# Patient Record
Sex: Female | Born: 1980 | Race: Black or African American | Hispanic: No | Marital: Married | Smoking: Never smoker
Health system: Southern US, Community
[De-identification: ages and names within clinical notes are randomized; demographics above are authoritative.]

## PROBLEM LIST (undated history)

## (undated) ENCOUNTER — Inpatient Hospital Stay (HOSPITAL_COMMUNITY): Payer: BC Managed Care – PPO

## (undated) DIAGNOSIS — D242 Benign neoplasm of left breast: Secondary | ICD-10-CM

## (undated) HISTORY — PX: NO PAST SURGERIES: SHX2092

---

## 2011-03-29 ENCOUNTER — Inpatient Hospital Stay (HOSPITAL_COMMUNITY)
Admission: AD | Admit: 2011-03-29 | Discharge: 2011-03-31 | DRG: 373 | Disposition: A | Discharge status: Home Health | Payer: BC Managed Care – PPO | Source: Ambulatory Visit | Attending: Obstetrics and Gynecology | Admitting: Obstetrics and Gynecology

## 2011-03-29 LAB — CBC
Platelets: 291 10*3/uL (ref 150–400)
RBC: 4.26 MIL/uL (ref 3.87–5.11)
RDW: 12.8 % (ref 11.5–15.5)
WBC: 14 10*3/uL — ABNORMAL HIGH (ref 4.0–10.5)

## 2011-03-29 LAB — RPR: RPR Ser Ql: NONREACTIVE

## 2011-03-30 LAB — CBC
Hemoglobin: 10.8 g/dL — ABNORMAL LOW (ref 12.0–15.0)
Platelets: 246 10*3/uL (ref 150–400)
RBC: 3.68 MIL/uL — ABNORMAL LOW (ref 3.87–5.11)
WBC: 16.1 10*3/uL — ABNORMAL HIGH (ref 4.0–10.5)

## 2013-11-21 LAB — OB RESULTS CONSOLE ABO/RH: RH TYPE: POSITIVE

## 2013-11-21 LAB — OB RESULTS CONSOLE RPR: RPR: NONREACTIVE

## 2013-11-21 LAB — OB RESULTS CONSOLE RUBELLA ANTIBODY, IGM: Rubella: IMMUNE

## 2013-11-21 LAB — OB RESULTS CONSOLE HEPATITIS B SURFACE ANTIGEN: Hepatitis B Surface Ag: NEGATIVE

## 2013-11-21 LAB — OB RESULTS CONSOLE ANTIBODY SCREEN: ANTIBODY SCREEN: NEGATIVE

## 2013-11-21 LAB — OB RESULTS CONSOLE HIV ANTIBODY (ROUTINE TESTING): HIV: NONREACTIVE

## 2014-02-18 ENCOUNTER — Inpatient Hospital Stay (HOSPITAL_COMMUNITY)
Admission: AD | Admit: 2014-02-18 | Payer: BC Managed Care – PPO | Source: Ambulatory Visit | Admitting: Obstetrics and Gynecology

## 2014-06-13 LAB — OB RESULTS CONSOLE GBS: GBS: NEGATIVE

## 2014-06-20 ENCOUNTER — Encounter (HOSPITAL_COMMUNITY): Payer: Self-pay | Admitting: *Deleted

## 2014-06-20 ENCOUNTER — Inpatient Hospital Stay (HOSPITAL_COMMUNITY)
Admission: AD | Admit: 2014-06-20 | Discharge: 2014-06-22 | DRG: 775 | Disposition: A | Discharge status: Home / Self Care | Payer: BC Managed Care – PPO | Source: Ambulatory Visit | Attending: Obstetrics and Gynecology | Admitting: Obstetrics and Gynecology

## 2014-06-20 DIAGNOSIS — IMO0001 Reserved for inherently not codable concepts without codable children: Secondary | ICD-10-CM

## 2014-06-20 LAB — CBC
HEMATOCRIT: 34.5 % — AB (ref 36.0–46.0)
HEMOGLOBIN: 11.1 g/dL — AB (ref 12.0–15.0)
MCH: 29.6 pg (ref 26.0–34.0)
MCHC: 32.2 g/dL (ref 30.0–36.0)
MCV: 92 fL (ref 78.0–100.0)
Platelets: 255 10*3/uL (ref 150–400)
RBC: 3.75 MIL/uL — ABNORMAL LOW (ref 3.87–5.11)
RDW: 12.5 % (ref 11.5–15.5)
WBC: 10.4 10*3/uL (ref 4.0–10.5)

## 2014-06-20 LAB — RPR

## 2014-06-20 MED ORDER — BUTORPHANOL TARTRATE 1 MG/ML IJ SOLN
1.0000 mg | Freq: Once | INTRAMUSCULAR | Status: AC
  Administered 2014-06-20: 1 mg via INTRAVENOUS
  Filled 2014-06-20: qty 1

## 2014-06-20 MED ORDER — ACETAMINOPHEN 325 MG PO TABS: 650.0000 mg | ORAL_TABLET | ORAL | Status: DC | PRN

## 2014-06-20 MED ORDER — TETANUS-DIPHTH-ACELL PERTUSSIS 5-2.5-18.5 LF-MCG/0.5 IM SUSP: 0.5000 mL | Freq: Once | INTRAMUSCULAR | Status: DC

## 2014-06-20 MED ORDER — SIMETHICONE 80 MG PO CHEW
80.0000 mg | CHEWABLE_TABLET | ORAL | Status: DC | PRN
Start: 2014-06-20 — End: 2014-06-22

## 2014-06-20 MED ORDER — EPHEDRINE 5 MG/ML INJ
10.0000 mg | INTRAVENOUS | Status: DC | PRN
  Filled 2014-06-20: qty 2

## 2014-06-20 MED ORDER — SENNOSIDES-DOCUSATE SODIUM 8.6-50 MG PO TABS
2.0000 | ORAL_TABLET | ORAL | Status: DC
  Administered 2014-06-21 (×2): 2 via ORAL
  Filled 2014-06-20 (×2): qty 2

## 2014-06-20 MED ORDER — CITRIC ACID-SODIUM CITRATE 334-500 MG/5ML PO SOLN: 30.0000 mL | ORAL | Status: DC | PRN

## 2014-06-20 MED ORDER — WITCH HAZEL-GLYCERIN EX PADS: 1.0000 "application " | MEDICATED_PAD | CUTANEOUS | Status: DC | PRN

## 2014-06-20 MED ORDER — DIPHENHYDRAMINE HCL 50 MG/ML IJ SOLN: 12.5000 mg | INTRAMUSCULAR | Status: DC | PRN

## 2014-06-20 MED ORDER — ONDANSETRON HCL 4 MG/2ML IJ SOLN: 4.0000 mg | INTRAMUSCULAR | Status: DC | PRN

## 2014-06-20 MED ORDER — LACTATED RINGERS IV SOLN: 500.0000 mL | Freq: Once | INTRAVENOUS | Status: DC

## 2014-06-20 MED ORDER — ONDANSETRON HCL 4 MG PO TABS: 4.0000 mg | ORAL_TABLET | ORAL | Status: DC | PRN

## 2014-06-20 MED ORDER — LACTATED RINGERS IV SOLN: 500.0000 mL | INTRAVENOUS | Status: DC | PRN

## 2014-06-20 MED ORDER — DIBUCAINE 1 % RE OINT: 1.0000 "application " | TOPICAL_OINTMENT | RECTAL | Status: DC | PRN

## 2014-06-20 MED ORDER — LIDOCAINE HCL (PF) 1 % IJ SOLN
30.0000 mL | INTRAMUSCULAR | Status: DC | PRN
  Administered 2014-06-20: 30 mL via SUBCUTANEOUS
  Filled 2014-06-20: qty 30

## 2014-06-20 MED ORDER — FLEET ENEMA 7-19 GM/118ML RE ENEM: 1.0000 | ENEMA | RECTAL | Status: DC | PRN

## 2014-06-20 MED ORDER — FLEET ENEMA 7-19 GM/118ML RE ENEM: 1.0000 | ENEMA | Freq: Every day | RECTAL | Status: DC | PRN

## 2014-06-20 MED ORDER — OXYTOCIN 40 UNITS IN LACTATED RINGERS INFUSION - SIMPLE MED
62.5000 mL/h | INTRAVENOUS | Status: DC
  Filled 2014-06-20: qty 1000

## 2014-06-20 MED ORDER — BISACODYL 10 MG RE SUPP: 10.0000 mg | Freq: Every day | RECTAL | Status: DC | PRN

## 2014-06-20 MED ORDER — BENZOCAINE-MENTHOL 20-0.5 % EX AERO
1.0000 "application " | INHALATION_SPRAY | CUTANEOUS | Status: DC | PRN
  Administered 2014-06-20: 1 via TOPICAL
  Filled 2014-06-20 (×2): qty 56

## 2014-06-20 MED ORDER — PHENYLEPHRINE 40 MCG/ML (10ML) SYRINGE FOR IV PUSH (FOR BLOOD PRESSURE SUPPORT)
80.0000 ug | PREFILLED_SYRINGE | INTRAVENOUS | Status: DC | PRN
  Filled 2014-06-20: qty 2

## 2014-06-20 MED ORDER — IBUPROFEN 600 MG PO TABS
600.0000 mg | ORAL_TABLET | Freq: Four times a day (QID) | ORAL | Status: DC | PRN
  Administered 2014-06-20: 600 mg via ORAL
  Filled 2014-06-20: qty 1

## 2014-06-20 MED ORDER — FENTANYL 2.5 MCG/ML BUPIVACAINE 1/10 % EPIDURAL INFUSION (WH - ANES): 14.0000 mL/h | INTRAMUSCULAR | Status: DC | PRN

## 2014-06-20 MED ORDER — ONDANSETRON HCL 4 MG/2ML IJ SOLN: 4.0000 mg | Freq: Four times a day (QID) | INTRAMUSCULAR | Status: DC | PRN

## 2014-06-20 MED ORDER — OXYTOCIN BOLUS FROM INFUSION
500.0000 mL | INTRAVENOUS | Status: DC
  Administered 2014-06-20: 500 mL via INTRAVENOUS

## 2014-06-20 MED ORDER — PRENATAL MULTIVITAMIN CH
1.0000 | ORAL_TABLET | Freq: Every day | ORAL | Status: DC
  Administered 2014-06-20 – 2014-06-22 (×3): 1 via ORAL
  Filled 2014-06-20 (×3): qty 1

## 2014-06-20 MED ORDER — LACTATED RINGERS IV SOLN: INTRAVENOUS | Status: DC

## 2014-06-20 MED ORDER — OXYCODONE-ACETAMINOPHEN 5-325 MG PO TABS: 1.0000 | ORAL_TABLET | ORAL | Status: DC | PRN

## 2014-06-20 MED ORDER — ZOLPIDEM TARTRATE 5 MG PO TABS: 5.0000 mg | ORAL_TABLET | Freq: Every evening | ORAL | Status: DC | PRN

## 2014-06-20 MED ORDER — LANOLIN HYDROUS EX OINT: TOPICAL_OINTMENT | CUTANEOUS | Status: DC | PRN

## 2014-06-20 MED ORDER — IBUPROFEN 600 MG PO TABS
600.0000 mg | ORAL_TABLET | Freq: Four times a day (QID) | ORAL | Status: DC
Start: 2014-06-20 — End: 2014-06-22
  Administered 2014-06-20 – 2014-06-22 (×9): 600 mg via ORAL
  Filled 2014-06-20 (×9): qty 1

## 2014-06-20 MED ORDER — DIPHENHYDRAMINE HCL 25 MG PO CAPS: 25.0000 mg | ORAL_CAPSULE | Freq: Four times a day (QID) | ORAL | Status: DC | PRN

## 2014-06-20 NOTE — Progress Notes (Signed)
Delivery Note At 6:36 AM a viable female was delivered via Vaginal, Spontaneous Delivery (Presentation: Left Occiput Anterior).  APGAR:8 ,9 ; weight .   Placenta status: Intact, Spontaneous.  Cord: 3 vessels with the following complications: None.  Cord pH: pending SROM clear Anesthesia: None  Episiotomy: None Lacerations: 2nd degree Suture Repair: 2.0 vicryl rapide Est. Blood Loss (mL): 250  Mom to postpartum.  Baby to Couplet care / Skin to Skin.  TOMBLIN II,JAMES E 06/20/2014, 6:56 AM

## 2014-06-20 NOTE — H&P (Signed)
Jill Hicks is a 33 y.o. female presenting for UCs. No ROM at home. Maternal Medical History:  Reason for admission: Contractions.   Fetal activity: Perceived fetal activity is normal.      OB History   Grav Para Term Preterm Abortions TAB SAB Ect Mult Living   3    1  1   1      No past medical history on file. No past surgical history on file. Family History: family history is not on file. Social History:  has no tobacco, alcohol, and drug history on file.   Prenatal Transfer Tool  Maternal Diabetes: No Genetic Screening: Normal Maternal Ultrasounds/Referrals: Normal Fetal Ultrasounds or other Referrals:  None Maternal Substance Abuse:  No Significant Maternal Medications:  None Significant Maternal Lab Results:  None Other Comments:  None  Review of Systems  Eyes: Negative for blurred vision.  Neurological: Negative for headaches.    Dilation: 10 Effacement (%): 100 Station: +2 Exam by:: K. Lashley RN Blood pressure 126/63, pulse 67, temperature 98.1 F (36.7 C), temperature source Axillary, resp. rate 22, height 5\' 6"  (1.676 m), weight 73.483 kg (162 lb). Maternal Exam:  Uterine Assessment: Contraction strength is firm.  Contraction frequency is regular.   Abdomen: Fetal presentation: vertex     Fetal Exam Fetal State Assessment: Category I - tracings are normal.     Physical Exam  Cardiovascular: Normal rate.   Respiratory: Effort normal.  Neurological: She has normal reflexes.    Prenatal labs: ABO, Rh: A/Positive/-- (12/01 0000) Antibody: Negative (12/01 0000) Rubella: Immune (12/01 0000) RPR: Nonreactive (12/01 0000)  HBsAg: Negative (12/01 0000)  HIV: Non-reactive (12/01 0000)  GBS: Negative (06/23 0000)   Assessment/Plan: 33 yo G3P1 at 38 2.7 weeks in active labor   TOMBLIN II,JAMES E 06/20/2014, 6:54 AM

## 2014-06-21 LAB — CBC
HCT: 30.8 % — ABNORMAL LOW (ref 36.0–46.0)
Hemoglobin: 10 g/dL — ABNORMAL LOW (ref 12.0–15.0)
MCH: 30.1 pg (ref 26.0–34.0)
MCHC: 32.5 g/dL (ref 30.0–36.0)
MCV: 92.8 fL (ref 78.0–100.0)
PLATELETS: 240 10*3/uL (ref 150–400)
RBC: 3.32 MIL/uL — ABNORMAL LOW (ref 3.87–5.11)
RDW: 12.6 % (ref 11.5–15.5)
WBC: 12.7 10*3/uL — ABNORMAL HIGH (ref 4.0–10.5)

## 2014-06-21 NOTE — Progress Notes (Signed)
Post Partum Day 1 Subjective: no complaints, up ad lib, voiding and tolerating PO  Objective: Blood pressure 103/64, pulse 68, temperature 97.5 F (36.4 C), temperature source Oral, resp. rate 18, height 5\' 6"  (1.676 m), weight 162 lb (73.483 kg), unknown if currently breastfeeding.  Physical Exam:  General: alert and cooperative Lochia: appropriate Uterine Fundus: firm Incision: perineum intact DVT Evaluation: No evidence of DVT seen on physical exam. Negative Homan's sign. No cords or calf tenderness. No significant calf/ankle edema.   Recent Labs  06/20/14 0603 06/21/14 0550  HGB 11.1* 10.0*  HCT 34.5* 30.8*    Assessment/Plan: Plan for discharge tomorrow and Circumcision prior to discharge   LOS: 1 day   CURTIS,CAROL G 06/21/2014, 7:58 AM

## 2014-06-22 MED ORDER — IBUPROFEN 600 MG PO TABS: 600.0000 mg | ORAL_TABLET | Freq: Four times a day (QID) | ORAL | Status: DC

## 2014-06-22 NOTE — Lactation Note (Signed)
This note was copied from the chart of [place]. Lactation Consultation Note Baby just finishing a feeding. Mom states feeds are going much better. States she is more comfortable, and that her milk has increased.  Enc mom to call if she has any concerns.   Patient Name: Jill Hicks [id number] Date: 06/22/2014     Maternal Data    Feeding Feeding Type: Breast Fed Length of feed: 20 min  LATCH Score/Interventions                      Lactation Tools Discussed/Used     Consult Status      Dorise Bullion 06/22/2014, 10:41 AM

## 2014-06-22 NOTE — Discharge Summary (Signed)
Obstetric Discharge Summary Reason for Admission: onset of labor Prenatal Procedures: ultrasound Intrapartum Procedures: spontaneous vaginal delivery Postpartum Procedures: none Complications-Operative and Postpartum: 2 degree perineal laceration Hemoglobin  Date Value Ref Range Status  06/21/2014 10.0* 12.0 - 15.0 g/dL Final     HCT  Date Value Ref Range Status  06/21/2014 30.8* 36.0 - 46.0 % Final    Physical Exam:  General: alert and cooperative Lochia: appropriate Uterine Fundus: firm Incision: perineum intact DVT Evaluation: No evidence of DVT seen on physical exam. Negative Homan's sign. No cords or calf tenderness. No significant calf/ankle edema.  Discharge Diagnoses: Term Pregnancy-delivered  Discharge Information: Date: 06/22/2014 Activity: pelvic rest Diet: routine Medications: PNV and Ibuprofen Condition: stable Instructions: refer to practice specific booklet Discharge to: home   Newborn Data: Live born female  Birth Weight: 7 lb 2.1 oz (3235 g) APGAR: 8, 9  Home with mother.  Jill Hicks G 06/22/2014, 7:57 AM

## 2014-10-23 ENCOUNTER — Encounter (HOSPITAL_COMMUNITY): Payer: Self-pay | Admitting: *Deleted

## 2016-12-18 ENCOUNTER — Other Ambulatory Visit: Payer: Self-pay | Admitting: Obstetrics and Gynecology

## 2016-12-18 DIAGNOSIS — N63 Unspecified lump in unspecified breast: Secondary | ICD-10-CM

## 2016-12-25 ENCOUNTER — Ambulatory Visit
Admission: RE | Admit: 2016-12-25 | Discharge: 2016-12-25 | Disposition: A | Discharge status: Home / Self Care | Payer: BLUE CROSS/BLUE SHIELD | Source: Ambulatory Visit | Attending: Obstetrics and Gynecology | Admitting: Obstetrics and Gynecology

## 2016-12-25 ENCOUNTER — Other Ambulatory Visit: Payer: Self-pay | Admitting: Obstetrics and Gynecology

## 2016-12-25 DIAGNOSIS — N63 Unspecified lump in unspecified breast: Secondary | ICD-10-CM

## 2020-03-09 ENCOUNTER — Ambulatory Visit: Payer: Medicaid Other | Attending: Internal Medicine

## 2020-03-09 DIAGNOSIS — Z20822 Contact with and (suspected) exposure to covid-19: Secondary | ICD-10-CM

## 2020-03-10 LAB — NOVEL CORONAVIRUS, NAA: SARS-CoV-2, NAA: NOT DETECTED

## 2020-12-26 ENCOUNTER — Other Ambulatory Visit: Payer: Self-pay | Admitting: Obstetrics and Gynecology

## 2020-12-26 DIAGNOSIS — R928 Other abnormal and inconclusive findings on diagnostic imaging of breast: Secondary | ICD-10-CM

## 2021-01-09 ENCOUNTER — Other Ambulatory Visit: Payer: Self-pay

## 2021-01-09 ENCOUNTER — Ambulatory Visit
Admission: RE | Admit: 2021-01-09 | Discharge: 2021-01-09 | Disposition: A | Discharge status: Home / Self Care | Payer: Medicaid Other | Source: Ambulatory Visit | Attending: Obstetrics and Gynecology | Admitting: Obstetrics and Gynecology

## 2021-01-09 ENCOUNTER — Other Ambulatory Visit: Payer: Self-pay | Admitting: Obstetrics and Gynecology

## 2021-01-09 DIAGNOSIS — R921 Mammographic calcification found on diagnostic imaging of breast: Secondary | ICD-10-CM

## 2021-01-09 DIAGNOSIS — R928 Other abnormal and inconclusive findings on diagnostic imaging of breast: Secondary | ICD-10-CM

## 2021-01-11 ENCOUNTER — Ambulatory Visit
Admission: RE | Admit: 2021-01-11 | Discharge: 2021-01-11 | Disposition: A | Discharge status: Home / Self Care | Payer: Medicaid Other | Source: Ambulatory Visit | Attending: Obstetrics and Gynecology | Admitting: Obstetrics and Gynecology

## 2021-01-11 ENCOUNTER — Other Ambulatory Visit: Payer: Self-pay

## 2021-01-11 DIAGNOSIS — R928 Other abnormal and inconclusive findings on diagnostic imaging of breast: Secondary | ICD-10-CM

## 2021-01-11 DIAGNOSIS — R921 Mammographic calcification found on diagnostic imaging of breast: Secondary | ICD-10-CM

## 2021-03-13 ENCOUNTER — Encounter: Payer: Self-pay | Admitting: Nurse Practitioner

## 2021-03-13 ENCOUNTER — Other Ambulatory Visit: Payer: Self-pay

## 2021-03-13 ENCOUNTER — Ambulatory Visit (INDEPENDENT_AMBULATORY_CARE_PROVIDER_SITE_OTHER): Payer: Medicaid Other | Admitting: Nurse Practitioner

## 2021-03-13 VITALS — BP 113/60 | HR 70 | Ht 66.0 in | Wt 139.0 lb

## 2021-03-13 DIAGNOSIS — M25649 Stiffness of unspecified hand, not elsewhere classified: Secondary | ICD-10-CM

## 2021-03-13 DIAGNOSIS — Z7689 Persons encountering health services in other specified circumstances: Secondary | ICD-10-CM

## 2021-03-13 DIAGNOSIS — M25561 Pain in right knee: Secondary | ICD-10-CM | POA: Diagnosis not present

## 2021-03-13 DIAGNOSIS — E559 Vitamin D deficiency, unspecified: Secondary | ICD-10-CM | POA: Insufficient documentation

## 2021-03-13 DIAGNOSIS — M94261 Chondromalacia, right knee: Secondary | ICD-10-CM | POA: Insufficient documentation

## 2021-03-13 DIAGNOSIS — G8929 Other chronic pain: Secondary | ICD-10-CM

## 2021-03-13 DIAGNOSIS — N920 Excessive and frequent menstruation with regular cycle: Secondary | ICD-10-CM | POA: Insufficient documentation

## 2021-03-13 DIAGNOSIS — L259 Unspecified contact dermatitis, unspecified cause: Secondary | ICD-10-CM | POA: Insufficient documentation

## 2021-03-13 DIAGNOSIS — M25562 Pain in left knee: Secondary | ICD-10-CM

## 2021-03-13 NOTE — Patient Instructions (Addendum)
Health Maintenance, Female Adopting a healthy lifestyle and getting preventive care are important in promoting health and wellness. Ask your health care provider about:  The right schedule for you to have regular tests and exams.  Things you can do on your own to prevent diseases and keep yourself healthy. What should I know about diet, weight, and exercise? Eat a healthy diet  Eat a diet that includes plenty of vegetables, fruits, low-fat dairy products, and lean protein.  Do not eat a lot of foods that are high in solid fats, added sugars, or sodium.   Maintain a healthy weight Body mass index (BMI) is used to identify weight problems. It estimates body fat based on height and weight. Your health care provider can help determine your BMI and help you achieve or maintain a healthy weight. Get regular exercise Get regular exercise. This is one of the most important things you can do for your health. Most adults should:  Exercise for at least 150 minutes each week. The exercise should increase your heart rate and make you sweat (moderate-intensity exercise).  Do strengthening exercises at least twice a week. This is in addition to the moderate-intensity exercise.  Spend less time sitting. Even light physical activity can be beneficial. Watch cholesterol and blood lipids Have your blood tested for lipids and cholesterol at 41 years of age, then have this test every 5 years. Have your cholesterol levels checked more often if:  Your lipid or cholesterol levels are high.  You are older than 40 years of age.  You are at high risk for heart disease. What should I know about cancer screening? Depending on your health history and family history, you may need to have cancer screening at various ages. This may include screening for:  Breast cancer.  Cervical cancer.  Colorectal cancer.  Skin cancer.  Lung cancer. What should I know about heart disease, diabetes, and high blood  pressure? Blood pressure and heart disease  High blood pressure causes heart disease and increases the risk of stroke. This is more likely to develop in people who have high blood pressure readings, are of African descent, or are overweight.  Have your blood pressure checked: ? Every 3-5 years if you are 18-39 years of age. ? Every year if you are 40 years old or older. Diabetes Have regular diabetes screenings. This checks your fasting blood sugar level. Have the screening done:  Once every three years after age 40 if you are at a normal weight and have a low risk for diabetes.  More often and at a younger age if you are overweight or have a high risk for diabetes. What should I know about preventing infection? Hepatitis B If you have a higher risk for hepatitis B, you should be screened for this virus. Talk with your health care provider to find out if you are at risk for hepatitis B infection. Hepatitis C Testing is recommended for:  Everyone born from 1945 through 1965.  Anyone with known risk factors for hepatitis C. Sexually transmitted infections (STIs)  Get screened for STIs, including gonorrhea and chlamydia, if: ? You are sexually active and are younger than 41 years of age. ? You are older than 41 years of age and your health care provider tells you that you are at risk for this type of infection. ? Your sexual activity has changed since you were last screened, and you are at increased risk for chlamydia or gonorrhea. Ask your health care provider   if you are at risk.  Ask your health care provider about whether you are at high risk for HIV. Your health care provider may recommend a prescription medicine to help prevent HIV infection. If you choose to take medicine to prevent HIV, you should first get tested for HIV. You should then be tested every 3 months for as long as you are taking the medicine. Pregnancy  If you are about to stop having your period (premenopausal) and  you may become pregnant, seek counseling before you get pregnant.  Take 400 to 800 micrograms (mcg) of folic acid every day if you become pregnant.  Ask for birth control (contraception) if you want to prevent pregnancy. Osteoporosis and menopause Osteoporosis is a disease in which the bones lose minerals and strength with aging. This can result in bone fractures. If you are 60 years old or older, or if you are at risk for osteoporosis and fractures, ask your health care provider if you should:  Be screened for bone loss.  Take a calcium or vitamin D supplement to lower your risk of fractures.  Be given hormone replacement therapy (HRT) to treat symptoms of menopause. Follow these instructions at home: Lifestyle  Do not use any products that contain nicotine or tobacco, such as cigarettes, e-cigarettes, and chewing tobacco. If you need help quitting, ask your health care provider.  Do not use street drugs.  Do not share needles.  Ask your health care provider for help if you need support or information about quitting drugs. Alcohol use  Do not drink alcohol if: ? Your health care provider tells you not to drink. ? You are pregnant, may be pregnant, or are planning to become pregnant.  If you drink alcohol: ? Limit how much you use to 0-1 drink a day. ? Limit intake if you are breastfeeding.  Be aware of how much alcohol is in your drink. In the [place] one drink equals one 12 oz bottle of beer (355 mL), one 5 oz glass of wine (148 mL), or one 1 oz glass of hard liquor (44 mL). General instructions  Schedule regular health, dental, and eye exams.  Stay current with your vaccines.  Tell your health care provider if: ? You often feel depressed. ? You have ever been abused or do not feel safe at home. Summary  Adopting a healthy lifestyle and getting preventive care are important in promoting health and wellness.  Follow your health care provider's instructions about healthy  diet, exercising, and getting tested or screened for diseases.  Follow your health care provider's instructions on monitoring your cholesterol and blood pressure. This information is not intended to replace advice given to you by your health care provider. Make sure you discuss any questions you have with your health care provider. Document Revised: 12/01/2018 Document Reviewed: 12/01/2018 Elsevier Patient Education  2021 [employer].   Hand Exercises Hand exercises can be helpful for almost anyone. These exercises can strengthen the hands, improve flexibility and movement, and increase blood flow to the hands. These results can make work and daily tasks easier. Hand exercises can be especially helpful for people who have joint pain from arthritis or have nerve damage from overuse (carpal tunnel syndrome). These exercises can also help people who have injured a hand. Exercises Most of these hand exercises are gentle stretching and motion exercises. It is usually safe to do them often throughout the day. Warming up your hands before exercise may help to reduce stiffness. You can  do this with gentle massage or by placing your hands in warm water for 10-15 minutes. It is normal to feel some stretching, pulling, tightness, or mild discomfort as you begin new exercises. This will gradually improve. Stop an exercise right away if you feel sudden, severe pain or your pain gets worse. Ask your health care provider which exercises are best for you. Knuckle bend or "claw" fist 1. Stand or sit with your arm, hand, and all five fingers pointed straight up. Make sure to keep your wrist straight during the exercise. 2. Gently bend your fingers down toward your palm until the tips of your fingers are touching the top of your palm. Keep your big knuckle straight and just bend the small knuckles in your fingers. 3. Hold this position for __________ seconds. 4. Straighten (extend) your fingers back to the starting  position. Repeat this exercise 5-10 times with each hand. Full finger fist 1. Stand or sit with your arm, hand, and all five fingers pointed straight up. Make sure to keep your wrist straight during the exercise. 2. Gently bend your fingers into your palm until the tips of your fingers are touching the middle of your palm. 3. Hold this position for __________ seconds. 4. Extend your fingers back to the starting position, stretching every joint fully. Repeat this exercise 5-10 times with each hand. Straight fist 1. Stand or sit with your arm, hand, and all five fingers pointed straight up. Make sure to keep your wrist straight during the exercise. 2. Gently bend your fingers at the big knuckle, where your fingers meet your hand, and the middle knuckle. Keep the knuckle at the tips of your fingers straight and try to touch the bottom of your palm. 3. Hold this position for __________ seconds. 4. Extend your fingers back to the starting position, stretching every joint fully. Repeat this exercise 5-10 times with each hand. Tabletop 1. Stand or sit with your arm, hand, and all five fingers pointed straight up. Make sure to keep your wrist straight during the exercise. 2. Gently bend your fingers at the big knuckle, where your fingers meet your hand, as far down as you can while keeping the small knuckles in your fingers straight. Think of forming a tabletop with your fingers. 3. Hold this position for __________ seconds. 4. Extend your fingers back to the starting position, stretching every joint fully. Repeat this exercise 5-10 times with each hand. Finger spread 1. Place your hand flat on a table with your palm facing down. Make sure your wrist stays straight as you do this exercise. 2. Spread your fingers and thumb apart from each other as far as you can until you feel a gentle stretch. Hold this position for __________ seconds. 3. Bring your fingers and thumb tight together again. Hold this  position for __________ seconds. Repeat this exercise 5-10 times with each hand. Making circles 1. Stand or sit with your arm, hand, and all five fingers pointed straight up. Make sure to keep your wrist straight during the exercise. 2. Make a circle by touching the tip of your thumb to the tip of your index finger. 3. Hold for __________ seconds. Then open your hand wide. 4. Repeat this motion with your thumb and each finger on your hand. Repeat this exercise 5-10 times with each hand. Thumb motion 1. Sit with your forearm resting on a table and your wrist straight. Your thumb should be facing up toward the ceiling. Keep your fingers relaxed as  you move your thumb. 2. Lift your thumb up as high as you can toward the ceiling. Hold for __________ seconds. 3. Bend your thumb across your palm as far as you can, reaching the tip of your thumb for the small finger (pinkie) side of your palm. Hold for __________ seconds. Repeat this exercise 5-10 times with each hand. Grip strengthening 1. Hold a stress ball or other soft ball in the middle of your hand. 2. Slowly increase the pressure, squeezing the ball as much as you can without causing pain. Think of bringing the tips of your fingers into the middle of your palm. All of your finger joints should bend when doing this exercise. 3. Hold your squeeze for __________ seconds, then relax. Repeat this exercise 5-10 times with each hand.   Contact a health care provider if:  Your hand pain or discomfort gets much worse when you do an exercise.  Your hand pain or discomfort does not improve within 2 hours after you exercise. If you have any of these problems, stop doing these exercises right away. Do not do them again unless your health care provider says that you can. Get help right away if:  You develop sudden, severe hand pain or swelling. If this happens, stop doing these exercises right away. Do not do them again unless your health care provider  says that you can. This information is not intended to replace advice given to you by your health care provider. Make sure you discuss any questions you have with your health care provider. Document Revised: 03/31/2019 Document Reviewed: 12/09/2018 Elsevier Patient Education  2021 [employer].

## 2021-03-13 NOTE — Progress Notes (Signed)
[facility] [place], [place]  [address] Phone:  [phone]   Fax:  [phone]   New Patient Office Visit  Subjective:  Patient ID: Jill Hicks, female    DOB: Apr 19, 1981  Age: 40 y.o. MRN: [id number]  CC:  Chief Complaint  Patient presents with  . Establish Care  . Hand Problem    Right thumb  . Knee Problem    Bilateral, dancer    HPI Jill Hicks presents to establish care. She  has no past medical history on file.   She presents for evaluation of right hand/finger pain. Onset was sudden, not related to any specific activity. She admits that she first noticed a nodule. This has been going on for one month. She is unsure it the nodule has gone away. She just has swelling in the whole joint. The pain is moderate, worsens with movement, and is relieved by movement. There is associated numbness in tip of the finger with some weakness.. Evaluation to date: none. Treatment to date: nothing specific. 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   Knee Pain Patient presents with knee pain involving both knees. Onset of the symptoms was several years ago. Inciting event: none known. She is a African Current symptoms include crepitus sensation. Pain is aggravated by dancing. Patient has had prior knee problems. Evaluation to date: plain films: normal. Treatment to date: avoidance of offending activity. She denies any swelling, numbness, tingling or weakness.   Family History  Problem Relation Age of Onset  . Arthritis Mother   . Ulcers Mother   . Hypertension Father     Social History   Socioeconomic History  . Marital status: Married    Spouse name: Not on file  . Number of children: 2  . Years of education: Not on file  . Highest education level: Not on file  Occupational History  . Not on file  Tobacco Use  . Smoking status: Never Smoker  . Smokeless tobacco: Never Used  Vaping  Use  . Vaping Use: Never used  Substance and Sexual Activity  . Alcohol use: Not Currently  . Drug use: Never  . Sexual activity: Not Currently    Partners: Male    Birth control/protection: None  Other Topics Concern  . Not on file  Social History Narrative   Dancer   Married Husband has prostate cancer   2 sons   Social Determinants of Health   Financial Resource Strain: Not on file  Food Insecurity: Not on file  Transportation Needs: Not on file  Physical Activity: Not on file  Stress: Not on file  Social Connections: Not on file  Intimate Partner Violence: Not on file    ROS Review of Systems  Constitutional: Negative.   HENT: Negative.   Eyes: Negative.   Respiratory: Negative.   Cardiovascular: Negative.   Gastrointestinal: Positive for constipation (of and on). Negative for nausea and vomiting.  Endocrine: Negative.   Genitourinary: Negative.  Negative for frequency.       Stress incontinence   Musculoskeletal: Positive for joint swelling.  Skin: Negative.   Allergic/Immunologic: Negative.   Hematological: Negative.     Objective:   Today's Vitals: BP 113/60   Pulse 70   Ht 5\' 6"  (1.676 m)   Wt 139 lb (63 kg)   LMP 03/10/2021   SpO2 100%   BMI 22.44 kg/m   Physical Exam  Constitutional:      General: She is not in acute distress.    Appearance: She is normal weight. She is not ill-appearing, toxic-appearing or diaphoretic.  HENT:     Head: Normocephalic and atraumatic.     Right Ear: Tympanic membrane normal.     Left Ear: Tympanic membrane normal.     Nose: Nose normal.     Mouth/Throat:     Mouth: Mucous membranes are moist.  Cardiovascular:     Rate and Rhythm: Normal rate and regular rhythm.     Pulses: Normal pulses.     Heart sounds: Normal heart sounds.  Pulmonary:     Effort: Pulmonary effort is normal.     Breath sounds: Normal breath sounds.  Abdominal:     General: Abdomen is flat. Bowel sounds are normal.     Palpations:  Abdomen is soft.     Tenderness: There is no right CVA tenderness or left CVA tenderness.  Musculoskeletal:     Right hand: Swelling present. No tenderness or bony tenderness. Decreased range of motion. Normal strength. Normal sensation. There is no disruption of two-point discrimination. Normal capillary refill.     Left hand: Normal.     Cervical back: Normal range of motion.     Comments: Bilaterally hands cool to the touch  Skin:    General: Skin is warm and dry.     Capillary Refill: Capillary refill takes less than 2 seconds.  Neurological:     General: No focal deficit present.     Mental Status: She is alert and oriented to person, place, and time.  Psychiatric:        Mood and Affect: Mood normal.        Behavior: Behavior normal.        Thought Content: Thought content normal.        Judgment: Judgment normal.     Assessment & Plan:   Problem List Items Addressed This Visit   None   Visit Diagnoses    Encounter to establish care    -  Primary Discussed female health maintenance; SBE, annual CBE, PAP test Discussed general safety in vehicle and COVID Discussed regular hydration with water Discussed healthy diet and exercise and weight management Discussed mental health Encouraged to call our office for an appointment with in ongoing concerns for questions.     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 Ferrous Sulfate (IRON PO) Take by mouth.  Marland Kitchen VITAMIN D PO Take by mouth.  . [DISCONTINUED] ibuprofen (ADVIL,MOTRIN) 600 MG tablet Take 1 tablet (600 mg total) by mouth every 6  (six) hours.  . [DISCONTINUED] Prenatal Vit-Fe Fumarate-FA (PRENATAL MULTIVITAMIN) TABS tablet Take 1 tablet by mouth daily at 12 noon.   No facility-administered encounter medications on file as of 03/13/2021.    Follow-up: Return for follow up as needed.   Vevelyn Francois, NP

## 2021-04-15 ENCOUNTER — Ambulatory Visit: Payer: Self-pay | Admitting: Surgery

## 2021-04-15 DIAGNOSIS — D242 Benign neoplasm of left breast: Secondary | ICD-10-CM

## 2021-04-15 NOTE — H&P (Signed)
Jill Hicks Appointment: 02/25/2021 3:00 PM Location: [facility] Patient #: [id number] DOB: 03-27-1981 Married / Language: English / Race: Black or African American Female   History of Present Illness Marcello Moores A. Anyia Gierke MD; 02/25/2021 3:38 PM) Patient words: Patient presents at the request of the [facility] [place] for abnormal left mammogram. She was found to have an area of calcifications and a 5 mm left breast mass. Core biopsy showed papilloma without atypia. No history of breast cancer in her family. No history of breast mass, nipple discharge or any other symptom.      The patient was called back for a left breast mass and left breast calcifications  EXAM: DIGITAL DIAGNOSTIC LEFT MAMMOGRAM WITH CAD AND TOMOSYNTHESIS  ULTRASOUND BREAST LEFT  TECHNIQUE: Left digital diagnostic mammography, ultrasound and breast tomosynthesis was performed. Digital images of the breast were evaluated with computer-aided detection. Targeted ultrasound examination of the breast was performed.  COMPARISON: Previous exam(s).  ACR Breast Density Category c: The breast tissue is heterogeneously dense, which may obscure small masses.  FINDINGS: The calcifications in the superolateral left breast persists on additional imaging and do not definitely layer. These calcifications span 5 mm.  The possible mass in the left breast resolves on the cc view. The finding also improves but does not completely resolve on the spot MLO and 90 degree lateral full paddle view.  On physical exam, no suspicious lumps are identified.  Targeted ultrasound is performed, showing fibrocystic changes throughout the superior left breast. There is a hypoechoic mass at 12 o'clock, 4 cm from the nipple measuring 4 x 3 x 5 mm which could represent a solid mass. No axillary adenopathy.  IMPRESSION: Indeterminate left breast calcifications. Indeterminate mass in the left breast seen with  ultrasound at 12 o'clock, 4 cm from the nipple.  RECOMMENDATION: Recommend stereotactic biopsy of the left breast calcifications. Recommend ultrasound-guided biopsy of the 12 o'clock left breast mass.  I have discussed the findings and recommendations with the patient. If applicable, a reminder letter will be sent to the patient regarding the next appointment.  BI-RADS CATEGORY 4: Suspicious.   Electronically Signed By: Dorise Bullion III M.D On: 01/09/2021 14:46   The patient was called back for a left breast mass and left breast calcifications  EXAM: DIGITAL DIAGNOSTIC LEFT MAMMOGRAM WITH CAD AND TOMOSYNTHESIS  ULTRASOUND BREAST LEFT  TECHNIQUE: Left digital diagnostic mammography, ultrasound and breast tomosynthesis was performed. Digital images of the breast were evaluated with computer-aided detection. Targeted ultrasound examination of the breast was performed.  COMPARISON: Previous exam(s).  ACR Breast Density Category c: The breast tissue is heterogeneously dense, which may obscure small masses.  FINDINGS: The calcifications in the superolateral left breast persists on additional imaging and do not definitely layer. These calcifications span 5 mm.  The possible mass in the left breast resolves on the cc view. The finding also improves but does not completely resolve on the spot MLO and 90 degree lateral full paddle view.  On physical exam, no suspicious lumps are identified.  Targeted ultrasound is performed, showing fibrocystic changes throughout the superior left breast. There is a hypoechoic mass at 12 o'clock, 4 cm from the nipple measuring 4 x 3 x 5 mm which could represent a solid mass. No axillary adenopathy.  IMPRESSION: Indeterminate left breast calcifications. Indeterminate mass in the left breast seen with ultrasound at 12 o'clock, 4 cm from the nipple.  RECOMMENDATION: Recommend stereotactic biopsy of the left breast  calcifications. Recommend ultrasound-guided biopsy  of the 12 o'clock left breast mass.  I have discussed the findings and recommendations with the patient. If applicable, a reminder letter will be sent to the patient regarding the next appointment.  BI-RADS CATEGORY 4: Suspicious.   Electronically Signed By: Dorise Bullion III M.D On: 01/09/2021 14:46         Diagnosis 1. 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 2. Breast, left, needle core biopsy, 12 o'clock 4 cmfn - INTRADUCTAL PAPILLOMA. Microscopic.  The patient is a 40 year old female.   Past Surgical History Sharyn Lull R. Brooks, CMA; 02/25/2021 3:06 PM) Breast Biopsy  Left.  Diagnostic Studies History Sharyn Lull R. Rolena Infante, CMA; 02/25/2021 3:06 PM) Colonoscopy  never Mammogram  within last year Pap Smear  1-5 years ago  Allergies Sharyn Lull R. Brooks, CMA; 02/25/2021 3:06 PM) No Known Drug Allergies  [02/25/2021]:  Medication History Sharyn Lull R. Brooks, CMA; 02/25/2021 3:06 PM) No Current Medications Medications Reconciled  Social History Sharyn Lull R. Brooks, CMA; 02/25/2021 3:06 PM) Alcohol use  Remotely quit alcohol use. Caffeine use  Tea. No drug use  Tobacco use  Never smoker.  Family History Sharyn Lull R. Rolena Infante, CMA; 02/25/2021 3:06 PM) Arthritis  Mother. Cerebrovascular Accident  Family Members In General. Diabetes Mellitus  Family Members In General. Hypertension  Family Members In General. Prostate Cancer  Family Members In General.  Pregnancy / Birth History Sharyn Lull R. Rolena Infante, CMA; 02/25/2021 3:06 PM) Age at menarche  86 years. Contraceptive History  Oral contraceptives. Gravida  3 Length (months) of breastfeeding  12-24 Maternal age  34-30 Para  2 Regular periods   Other Problems Sharyn Lull R. Rolena Infante, CMA; 02/25/2021 3:06 PM) Arthritis     Review of Systems ([place].  Brooks CMA; 02/25/2021 3:06 PM) General Not Present- Appetite Loss, Chills, Fatigue, Fever, Night Sweats, Weight Gain and Weight Loss. Skin Not Present- Change in Wart/Mole, Dryness, Hives, Jaundice, New Lesions, Non-Healing Wounds, Rash and Ulcer. HEENT Not Present- Earache, Hearing Loss, Hoarseness, Nose Bleed, Oral Ulcers, Ringing in the Ears, Seasonal Allergies, Sinus Pain, Sore Throat, Visual Disturbances, Wears glasses/contact lenses and Yellow Eyes. Respiratory Not Present- Bloody sputum, Chronic Cough, Difficulty Breathing, Snoring and Wheezing. Breast Not Present- Breast Mass, Breast Pain, Nipple Discharge and Skin Changes. Cardiovascular Not Present- Chest Pain, Difficulty Breathing Lying Down, Leg Cramps, Palpitations, Rapid Heart Rate, Shortness of Breath and Swelling of Extremities. Gastrointestinal Present- Excessive gas. Not Present- Abdominal Pain, Bloating, Bloody Stool, Change in Bowel Habits, Chronic diarrhea, Constipation, Difficulty Swallowing, Gets full quickly at meals, Hemorrhoids, Indigestion, Nausea, Rectal Pain and Vomiting. Female Genitourinary Not Present- Frequency, Nocturia, Painful Urination, Pelvic Pain and Urgency. Musculoskeletal Not Present- Back Pain, Joint Pain, Joint Stiffness, Muscle Pain, Muscle Weakness and Swelling of Extremities. Neurological Not Present- Decreased Memory, Fainting, Headaches, Numbness, Seizures, Tingling, Tremor, Trouble walking and Weakness. Psychiatric Not Present- Anxiety, Bipolar, Change in Sleep Pattern, Depression, Fearful and Frequent crying. Endocrine Not Present- Cold Intolerance, Excessive Hunger, Hair Changes, Heat Intolerance, Hot flashes and New Diabetes. Hematology Not Present- Blood Thinners, Easy Bruising, Excessive bleeding, Gland problems, HIV and Persistent Infections.  Vitals [employer] R. Brooks CMA; 02/25/2021 3:06 PM) 02/25/2021 3:06 PM Weight: 139.25 lb Height: 66in Body Surface Area: 1.71 m Body Mass Index: 22.48  kg/m  Pulse: 107 (Regular)        Physical Exam (Srihith Aquilino A. Laith Antonelli MD; 02/25/2021 3:38 PM) General Mental Status-Alert. General Appearance-Consistent with stated age. Hydration-Well hydrated. Voice-Normal.  Head and Neck Head-normocephalic, atraumatic  with no lesions or palpable masses. Trachea-midline. Thyroid Gland Characteristics - normal size and consistency.  Breast Breast - Left-Symmetric, Non Tender, No Biopsy scars, no Dimpling - Left, No Inflammation, No Lumpectomy scars, No Mastectomy scars, No Peau d' Orange. Breast - Right-Symmetric, Non Tender, No Biopsy scars, no Dimpling - Right, No Inflammation, No Lumpectomy scars, No Mastectomy scars, No Peau d' Orange. Breast Lump-No Palpable Breast Mass.  Neurologic Neurologic evaluation reveals -alert and oriented x 3 with no impairment of recent or remote memory. Mental Status-Normal.  Lymphatic Head & Neck  General Head & Neck Lymphatics: Bilateral - Description - Normal. Axillary  General Axillary Region: Bilateral - Description - Normal. Tenderness - Non Tender.    Assessment & Plan (Isaish Alemu A. Talah Cookston MD; 02/25/2021 3:40 PM) PAPILLOMA OF LEFT BREAST (D24.2) Impression: Discussed the pros and cons of surgical excision. Reviewed potential malignancy rates of 2.5 - 3% in the literature. Reviewed upgrade risk of 7-9% to atypical ductal or lobular lesion. Discussed observation as well given the low risk given characteristics of tumor being less than a centimeter in size, no palpable mass, no discharge and no metachronous lesions. She will think over surgery and let us know. I discussed the localized left breast lumpectomy, palpitations, recovery time, a long-term expectations. I've asked her to contact us sooner way so we can have a plan in place for her  total time 30 minutes Current Plans Pt Education - [facility] Free Text Education/Instructions: discussed with patient and provided information.

## 2021-04-18 ENCOUNTER — Other Ambulatory Visit: Payer: Self-pay | Admitting: Surgery

## 2021-04-18 DIAGNOSIS — D242 Benign neoplasm of left breast: Secondary | ICD-10-CM

## 2021-04-24 ENCOUNTER — Ambulatory Visit: Payer: Medicaid Other | Attending: Critical Care Medicine

## 2021-04-24 DIAGNOSIS — Z8616 Personal history of COVID-19: Secondary | ICD-10-CM

## 2021-04-24 DIAGNOSIS — Z20822 Contact with and (suspected) exposure to covid-19: Secondary | ICD-10-CM

## 2021-04-24 HISTORY — DX: Personal history of COVID-19: Z86.16

## 2021-04-26 LAB — NOVEL CORONAVIRUS, NAA: SARS-CoV-2, NAA: DETECTED — AB

## 2021-04-27 ENCOUNTER — Telehealth: Payer: Self-pay | Admitting: Family

## 2021-04-27 NOTE — Telephone Encounter (Addendum)
Called to discuss with patient about COVID-19 symptoms and the use of one of the available treatments for those with mild to moderate Covid symptoms and at a high risk of hospitalization.  Pt appears to qualify for outpatient treatment due to co-morbid conditions and/or a member of an at-risk group in accordance with the FDA Emergency Use Authorization.    Symptom onset: 5/3  Vaccinated: No Booster? No Immunocompromised? No Qualifiers: Elevated SVI score NIH Criteria:   I spoke with Ms. Williams who tested positive for Covid during perioperative screening. She had allergy like symptoms that started on 5/3 that have since dissipated and is currently have some soreness in her eyes. We discussed the risks and benefits of treatment with monoclonal antibodies and antiviral medications. At this point the risks outweigh the benefits as she has minimal risk factors and is otherwise healthy. Advised to contact her PCP if symptoms return or worsen.  Terri Piedra, NP 04/27/2021 9:44 AM

## 2021-05-28 ENCOUNTER — Other Ambulatory Visit: Payer: Self-pay

## 2021-05-28 ENCOUNTER — Encounter (HOSPITAL_BASED_OUTPATIENT_CLINIC_OR_DEPARTMENT_OTHER): Payer: Self-pay | Admitting: Surgery

## 2021-06-03 ENCOUNTER — Other Ambulatory Visit (HOSPITAL_COMMUNITY): Payer: Medicaid Other

## 2021-06-03 ENCOUNTER — Encounter (HOSPITAL_BASED_OUTPATIENT_CLINIC_OR_DEPARTMENT_OTHER)
Admission: RE | Admit: 2021-06-03 | Discharge: 2021-06-03 | Disposition: A | Discharge status: Home / Self Care | Payer: Medicaid Other | Source: Ambulatory Visit | Attending: Surgery | Admitting: Surgery

## 2021-06-03 DIAGNOSIS — Z01812 Encounter for preprocedural laboratory examination: Secondary | ICD-10-CM | POA: Insufficient documentation

## 2021-06-03 LAB — COMPREHENSIVE METABOLIC PANEL
ALT: 15 U/L (ref 0–44)
AST: 19 U/L (ref 15–41)
Albumin: 4 g/dL (ref 3.5–5.0)
Alkaline Phosphatase: 27 U/L — ABNORMAL LOW (ref 38–126)
Anion gap: 7 (ref 5–15)
BUN: 9 mg/dL (ref 6–20)
CO2: 27 mmol/L (ref 22–32)
Calcium: 9.3 mg/dL (ref 8.9–10.3)
Chloride: 105 mmol/L (ref 98–111)
Creatinine, Ser: 0.7 mg/dL (ref 0.44–1.00)
GFR, Estimated: >60 mL/min (ref >60)
Glucose, Bld: 87 mg/dL (ref 70–99)
Potassium: 4.6 mmol/L (ref 3.5–5.1)
Sodium: 139 mmol/L (ref 135–145)
Total Bilirubin: 0.9 mg/dL (ref 0.3–1.2)
Total Protein: 6.8 g/dL (ref 6.5–8.1)

## 2021-06-03 LAB — POCT PREGNANCY, URINE: Preg Test, Ur: NEGATIVE

## 2021-06-03 NOTE — Progress Notes (Addendum)

## 2021-06-04 ENCOUNTER — Encounter (HOSPITAL_BASED_OUTPATIENT_CLINIC_OR_DEPARTMENT_OTHER)
Admission: RE | Admit: 2021-06-04 | Discharge: 2021-06-04 | Disposition: A | Discharge status: Home / Self Care | Payer: Medicaid Other | Source: Ambulatory Visit | Attending: Surgery | Admitting: Surgery

## 2021-06-04 ENCOUNTER — Ambulatory Visit
Admission: RE | Admit: 2021-06-04 | Discharge: 2021-06-04 | Disposition: A | Discharge status: Home / Self Care | Payer: Medicaid Other | Source: Ambulatory Visit | Attending: Surgery | Admitting: Surgery

## 2021-06-04 ENCOUNTER — Other Ambulatory Visit: Payer: Self-pay

## 2021-06-04 DIAGNOSIS — Z833 Family history of diabetes mellitus: Secondary | ICD-10-CM | POA: Diagnosis not present

## 2021-06-04 DIAGNOSIS — Z8249 Family history of ischemic heart disease and other diseases of the circulatory system: Secondary | ICD-10-CM | POA: Diagnosis not present

## 2021-06-04 DIAGNOSIS — D242 Benign neoplasm of left breast: Secondary | ICD-10-CM

## 2021-06-04 DIAGNOSIS — Z8042 Family history of malignant neoplasm of prostate: Secondary | ICD-10-CM | POA: Diagnosis not present

## 2021-06-04 DIAGNOSIS — N6489 Other specified disorders of breast: Secondary | ICD-10-CM | POA: Diagnosis not present

## 2021-06-04 LAB — CBC WITH DIFFERENTIAL/PLATELET
Abs Immature Granulocytes: 0 10*3/uL (ref 0.00–0.07)
Basophils Absolute: 0.1 10*3/uL (ref 0.0–0.1)
Basophils Relative: 1 %
Eosinophils Absolute: 0 10*3/uL (ref 0.0–0.5)
Eosinophils Relative: 1 %
HCT: 36.1 % (ref 36.0–46.0)
Hemoglobin: 11.1 g/dL — ABNORMAL LOW (ref 12.0–15.0)
Immature Granulocytes: 0 %
Lymphocytes Relative: 46 %
Lymphs Abs: 2.1 10*3/uL (ref 0.7–4.0)
MCH: 28.7 pg (ref 26.0–34.0)
MCHC: 30.7 g/dL (ref 30.0–36.0)
MCV: 93.3 fL (ref 80.0–100.0)
Monocytes Absolute: 0.3 10*3/uL (ref 0.1–1.0)
Monocytes Relative: 7 %
Neutro Abs: 2 10*3/uL (ref 1.7–7.7)
Neutrophils Relative %: 45 %
Platelets: 300 10*3/uL (ref 150–400)
RBC: 3.87 MIL/uL (ref 3.87–5.11)
RDW: 11.7 % (ref 11.5–15.5)
WBC: 4.5 10*3/uL (ref 4.0–10.5)
nRBC: 0 % (ref 0.0–0.2)

## 2021-06-05 ENCOUNTER — Ambulatory Visit
Admission: RE | Admit: 2021-06-05 | Discharge: 2021-06-05 | Disposition: A | Discharge status: Home / Self Care | Payer: Medicaid Other | Source: Ambulatory Visit | Attending: Surgery | Admitting: Surgery

## 2021-06-05 ENCOUNTER — Ambulatory Visit (HOSPITAL_BASED_OUTPATIENT_CLINIC_OR_DEPARTMENT_OTHER): Payer: Medicaid Other | Admitting: Certified Registered"

## 2021-06-05 ENCOUNTER — Encounter (HOSPITAL_BASED_OUTPATIENT_CLINIC_OR_DEPARTMENT_OTHER)
Admission: RE | Disposition: A | Discharge status: Home / Self Care | Payer: Self-pay | Source: Home / Self Care | Attending: Surgery

## 2021-06-05 ENCOUNTER — Encounter (HOSPITAL_BASED_OUTPATIENT_CLINIC_OR_DEPARTMENT_OTHER): Payer: Self-pay | Admitting: Surgery

## 2021-06-05 ENCOUNTER — Ambulatory Visit (HOSPITAL_BASED_OUTPATIENT_CLINIC_OR_DEPARTMENT_OTHER)
Admission: RE | Admit: 2021-06-05 | Discharge: 2021-06-05 | Disposition: A | Discharge status: Home / Self Care | Payer: Medicaid Other | Attending: Surgery | Admitting: Surgery

## 2021-06-05 ENCOUNTER — Other Ambulatory Visit: Payer: Self-pay

## 2021-06-05 DIAGNOSIS — Z833 Family history of diabetes mellitus: Secondary | ICD-10-CM | POA: Insufficient documentation

## 2021-06-05 DIAGNOSIS — Z8249 Family history of ischemic heart disease and other diseases of the circulatory system: Secondary | ICD-10-CM | POA: Diagnosis not present

## 2021-06-05 DIAGNOSIS — N6489 Other specified disorders of breast: Secondary | ICD-10-CM | POA: Insufficient documentation

## 2021-06-05 DIAGNOSIS — Z8042 Family history of malignant neoplasm of prostate: Secondary | ICD-10-CM | POA: Insufficient documentation

## 2021-06-05 DIAGNOSIS — D242 Benign neoplasm of left breast: Secondary | ICD-10-CM

## 2021-06-05 HISTORY — PX: BREAST LUMPECTOMY WITH RADIOACTIVE SEED LOCALIZATION: SHX6424

## 2021-06-05 HISTORY — DX: Benign neoplasm of left breast: D24.2

## 2021-06-05 LAB — POCT PREGNANCY, URINE: Preg Test, Ur: NEGATIVE

## 2021-06-05 SURGERY — BREAST LUMPECTOMY WITH RADIOACTIVE SEED LOCALIZATION
Anesthesia: General | Site: Breast | Laterality: Left

## 2021-06-05 MED ORDER — MIDAZOLAM HCL 2 MG/2ML IJ SOLN
INTRAMUSCULAR | Status: AC
  Filled 2021-06-05: qty 2

## 2021-06-05 MED ORDER — MEPERIDINE HCL 25 MG/ML IJ SOLN: 6.2500 mg | INTRAMUSCULAR | Status: DC | PRN

## 2021-06-05 MED ORDER — ONDANSETRON HCL 4 MG/2ML IJ SOLN
INTRAMUSCULAR | Status: DC | PRN
  Administered 2021-06-05: 4 mg via INTRAVENOUS

## 2021-06-05 MED ORDER — CHLORHEXIDINE GLUCONATE CLOTH 2 % EX PADS
6.0000 | MEDICATED_PAD | Freq: Once | CUTANEOUS | Status: DC
Start: 2021-06-05 — End: 2021-06-05

## 2021-06-05 MED ORDER — HYDROMORPHONE HCL 1 MG/ML IJ SOLN: 0.2500 mg | INTRAMUSCULAR | Status: DC | PRN

## 2021-06-05 MED ORDER — 0.9 % SODIUM CHLORIDE (POUR BTL) OPTIME
TOPICAL | Status: DC | PRN
  Administered 2021-06-05: 1000 mL

## 2021-06-05 MED ORDER — LIDOCAINE HCL (PF) 2 % IJ SOLN
INTRAMUSCULAR | Status: AC
  Filled 2021-06-05: qty 5

## 2021-06-05 MED ORDER — PROMETHAZINE HCL 25 MG/ML IJ SOLN: 6.2500 mg | INTRAMUSCULAR | Status: DC | PRN

## 2021-06-05 MED ORDER — AMISULPRIDE (ANTIEMETIC) 5 MG/2ML IV SOLN: 10.0000 mg | Freq: Once | INTRAVENOUS | Status: DC | PRN

## 2021-06-05 MED ORDER — PROPOFOL 10 MG/ML IV BOLUS
INTRAVENOUS | Status: DC | PRN
  Administered 2021-06-05: 200 mg via INTRAVENOUS

## 2021-06-05 MED ORDER — CEFAZOLIN SODIUM-DEXTROSE 2-4 GM/100ML-% IV SOLN
INTRAVENOUS | Status: AC
  Filled 2021-06-05: qty 100

## 2021-06-05 MED ORDER — FENTANYL CITRATE (PF) 100 MCG/2ML IJ SOLN
INTRAMUSCULAR | Status: DC | PRN
  Administered 2021-06-05: 25 ug via INTRAVENOUS
  Administered 2021-06-05: 50 ug via INTRAVENOUS

## 2021-06-05 MED ORDER — MIDAZOLAM HCL 5 MG/5ML IJ SOLN
INTRAMUSCULAR | Status: DC | PRN
  Administered 2021-06-05: 2 mg via INTRAVENOUS

## 2021-06-05 MED ORDER — FENTANYL CITRATE (PF) 100 MCG/2ML IJ SOLN
INTRAMUSCULAR | Status: AC
  Filled 2021-06-05: qty 2

## 2021-06-05 MED ORDER — CEFAZOLIN SODIUM-DEXTROSE 2-4 GM/100ML-% IV SOLN
2.0000 g | INTRAVENOUS | Status: AC
  Administered 2021-06-05: 2 g via INTRAVENOUS

## 2021-06-05 MED ORDER — LACTATED RINGERS IV SOLN: INTRAVENOUS | Status: DC

## 2021-06-05 MED ORDER — HYDROCODONE-ACETAMINOPHEN 5-325 MG PO TABS: 1.0000 | ORAL_TABLET | Freq: Four times a day (QID) | ORAL | 0 refills | Status: DC | PRN

## 2021-06-05 MED ORDER — DEXAMETHASONE SODIUM PHOSPHATE 4 MG/ML IJ SOLN
INTRAMUSCULAR | Status: DC | PRN
  Administered 2021-06-05: 6 mg via INTRAVENOUS

## 2021-06-05 MED ORDER — DEXAMETHASONE SODIUM PHOSPHATE 10 MG/ML IJ SOLN
INTRAMUSCULAR | Status: AC
  Filled 2021-06-05: qty 1

## 2021-06-05 MED ORDER — LIDOCAINE 2% (20 MG/ML) 5 ML SYRINGE
INTRAMUSCULAR | Status: DC | PRN
  Administered 2021-06-05: 60 mg via INTRAVENOUS

## 2021-06-05 MED ORDER — IBUPROFEN 800 MG PO TABS: 800.0000 mg | ORAL_TABLET | Freq: Three times a day (TID) | ORAL | 0 refills | Status: DC | PRN

## 2021-06-05 MED ORDER — OXYCODONE HCL 5 MG/5ML PO SOLN: 5.0000 mg | Freq: Once | ORAL | Status: DC | PRN

## 2021-06-05 MED ORDER — BUPIVACAINE HCL 0.25 % IJ SOLN
INTRAMUSCULAR | Status: DC | PRN
  Administered 2021-06-05: 20 mL

## 2021-06-05 MED ORDER — OXYCODONE HCL 5 MG PO TABS: 5.0000 mg | ORAL_TABLET | Freq: Once | ORAL | Status: DC | PRN

## 2021-06-05 MED ORDER — PROPOFOL 10 MG/ML IV BOLUS
INTRAVENOUS | Status: AC
  Filled 2021-06-05: qty 20

## 2021-06-05 SURGICAL SUPPLY — 52 items
ADH SKN CLS APL DERMABOND .7 (GAUZE/BANDAGES/DRESSINGS) ×1
APL PRP STRL LF DISP 70% ISPRP (MISCELLANEOUS) ×1
APPLIER CLIP 9.375 MED OPEN (MISCELLANEOUS)
APR CLP MED 9.3 20 MLT OPN (MISCELLANEOUS)
BINDER BREAST LRG (GAUZE/BANDAGES/DRESSINGS) IMPLANT
BINDER BREAST MEDIUM (GAUZE/BANDAGES/DRESSINGS) IMPLANT
BINDER BREAST XLRG (GAUZE/BANDAGES/DRESSINGS) IMPLANT
BINDER BREAST XXLRG (GAUZE/BANDAGES/DRESSINGS) IMPLANT
BLADE SURG 15 STRL LF DISP TIS (BLADE) ×1 IMPLANT
BLADE SURG 15 STRL SS (BLADE) ×3
CANISTER SUC SOCK COL 7IN (MISCELLANEOUS) IMPLANT
CANISTER SUCT 1200ML W/VALVE (MISCELLANEOUS) IMPLANT
CHLORAPREP W/TINT 26 (MISCELLANEOUS) ×3 IMPLANT
CLIP APPLIE 9.375 MED OPEN (MISCELLANEOUS) IMPLANT
COVER BACK TABLE 60X90IN (DRAPES) ×3 IMPLANT
COVER MAYO STAND STRL (DRAPES) ×3 IMPLANT
COVER PROBE W GEL 5X96 (DRAPES) ×3 IMPLANT
COVER WAND RF STERILE (DRAPES) IMPLANT
DECANTER SPIKE VIAL GLASS SM (MISCELLANEOUS) IMPLANT
DERMABOND ADVANCED (GAUZE/BANDAGES/DRESSINGS) ×2
DERMABOND ADVANCED .7 DNX12 (GAUZE/BANDAGES/DRESSINGS) ×1 IMPLANT
DRAPE LAPAROSCOPIC ABDOMINAL (DRAPES) IMPLANT
DRAPE LAPAROTOMY 100X72 PEDS (DRAPES) ×3 IMPLANT
DRAPE UTILITY XL STRL (DRAPES) ×3 IMPLANT
ELECT COATED BLADE 2.86 ST (ELECTRODE) ×3 IMPLANT
ELECT REM PT RETURN 9FT ADLT (ELECTROSURGICAL) ×3
ELECTRODE REM PT RTRN 9FT ADLT (ELECTROSURGICAL) ×1 IMPLANT
GLOVE SRG 8 PF TXTR STRL LF DI (GLOVE) ×1 IMPLANT
GLOVE SURG LTX SZ8 (GLOVE) ×3 IMPLANT
GLOVE SURG UNDER POLY LF SZ8 (GLOVE) ×3
GOWN STRL REUS W/ TWL LRG LVL3 (GOWN DISPOSABLE) ×2 IMPLANT
GOWN STRL REUS W/ TWL XL LVL3 (GOWN DISPOSABLE) ×1 IMPLANT
GOWN STRL REUS W/TWL LRG LVL3 (GOWN DISPOSABLE) ×6
GOWN STRL REUS W/TWL XL LVL3 (GOWN DISPOSABLE) ×3
HEMOSTAT ARISTA ABSORB 3G PWDR (HEMOSTASIS) IMPLANT
HEMOSTAT SNOW SURGICEL 2X4 (HEMOSTASIS) IMPLANT
KIT MARKER MARGIN INK (KITS) ×3 IMPLANT
NEEDLE HYPO 25X1 1.5 SAFETY (NEEDLE) ×3 IMPLANT
NS IRRIG 1000ML POUR BTL (IV SOLUTION) ×3 IMPLANT
PACK BASIN DAY SURGERY FS (CUSTOM PROCEDURE TRAY) ×3 IMPLANT
PENCIL SMOKE EVACUATOR (MISCELLANEOUS) ×3 IMPLANT
SLEEVE SCD COMPRESS KNEE MED (STOCKING) ×3 IMPLANT
SPONGE LAP 4X18 RFD (DISPOSABLE) ×3 IMPLANT
SUT MNCRL AB 4-0 PS2 18 (SUTURE) ×3 IMPLANT
SUT SILK 2 0 SH (SUTURE) IMPLANT
SUT VICRYL 3-0 CR8 SH (SUTURE) ×3 IMPLANT
SYR CONTROL 10ML LL (SYRINGE) ×3 IMPLANT
TOWEL GREEN STERILE FF (TOWEL DISPOSABLE) ×3 IMPLANT
TRAY FAXITRON CT DISP (TRAY / TRAY PROCEDURE) ×3 IMPLANT
TUBE CONNECTING 20'X1/4 (TUBING)
TUBE CONNECTING 20X1/4 (TUBING) IMPLANT
YANKAUER SUCT BULB TIP NO VENT (SUCTIONS) IMPLANT

## 2021-06-05 NOTE — Anesthesia Preprocedure Evaluation (Signed)
Anesthesia Evaluation  Patient identified by MRN, date of birth, ID band Patient awake    Reviewed: Allergy & Precautions, NPO status , Patient's Chart, lab work & pertinent test results  Airway Mallampati: II  TM Distance: >3 FB Neck ROM: Full    Dental no notable dental hx.    Pulmonary neg pulmonary ROS,    Pulmonary exam normal breath sounds clear to auscultation       Cardiovascular negative cardio ROS Normal cardiovascular exam Rhythm:Regular Rate:Normal     Neuro/Psych negative neurological ROS  negative psych ROS   GI/Hepatic negative GI ROS, Neg liver ROS,   Endo/Other  negative endocrine ROS  Renal/GU negative Renal ROS  negative genitourinary   Musculoskeletal  (+) Arthritis , Osteoarthritis,    Abdominal   Peds negative pediatric ROS (+)  Hematology negative hematology ROS (+)   Anesthesia Other Findings   Reproductive/Obstetrics negative OB ROS                             Anesthesia Physical Anesthesia Plan  ASA: 2  Anesthesia Plan: General   Post-op Pain Management:    Induction: Intravenous  PONV Risk Score and Plan: 3 and Ondansetron, Dexamethasone, Midazolam and Treatment may vary due to age or medical condition  Airway Management Planned: LMA  Additional Equipment:   Intra-op Plan:   Post-operative Plan: Extubation in OR  Informed Consent: I have reviewed the patients History and Physical, chart, labs and discussed the procedure including the risks, benefits and alternatives for the proposed anesthesia with the patient or authorized representative who has indicated his/her understanding and acceptance.     Dental advisory given  Plan Discussed with: CRNA  Anesthesia Plan Comments:         Anesthesia Quick Evaluation

## 2021-06-05 NOTE — Anesthesia Postprocedure Evaluation (Signed)
Anesthesia Post Note  Patient: Jill Hicks  Procedure(s) Performed: LEFT BREAST LUMPECTOMY WITH RADIOACTIVE SEED LOCALIZATION (Left: Breast)     Patient location during evaluation: PACU Anesthesia Type: General Level of consciousness: awake and alert Pain management: pain level controlled Vital Signs Assessment: post-procedure vital signs reviewed and stable Respiratory status: spontaneous breathing, nonlabored ventilation and respiratory function stable Cardiovascular status: blood pressure returned to baseline and stable Postop Assessment: no apparent nausea or vomiting Anesthetic complications: no   No notable events documented.  Last Vitals:  Vitals:   06/05/21 1000 06/05/21 1015  BP: (!) 121/91 (!) 128/92  Pulse: (!) 57 (!) 52  Resp: 18 15  Temp:    SpO2: 99% 99%    Last Pain:  Vitals:   06/05/21 1031  TempSrc:   PainSc: 0-No pain                 Lynda Rainwater

## 2021-06-05 NOTE — Discharge Instructions (Addendum)
[facility] Office Phone Number [phone]  BREAST BIOPSY/ PARTIAL MASTECTOMY: POST OP INSTRUCTIONS  Always review your discharge instruction sheet given to you by the facility where your surgery was performed.  IF YOU HAVE DISABILITY OR FAMILY LEAVE FORMS, YOU MUST BRING THEM TO THE OFFICE FOR PROCESSING.  DO NOT GIVE THEM TO YOUR DOCTOR.  A prescription for pain medication may be given to you upon discharge.  Take your pain medication as prescribed, if needed.  If narcotic pain medicine is not needed, then you may take acetaminophen (Tylenol) or ibuprofen (Advil) as needed. Take your usually prescribed medications unless otherwise directed If you need a refill on your pain medication, please contact your pharmacy.  They will contact our office to request authorization.  Prescriptions will not be filled after 5pm or on week-ends. You should eat very light the first 24 hours after surgery, such as soup, crackers, pudding, etc.  Resume your normal diet the day after surgery. Most patients will experience some swelling and bruising in the breast.  Ice packs and a good support bra will help.  Swelling and bruising can take several days to resolve.  It is common to experience some constipation if taking pain medication after surgery.  Increasing fluid intake and taking a stool softener will usually help or prevent this problem from occurring.  A mild laxative (Milk of Magnesia or Miralax) should be taken according to package directions if there are no bowel movements after 48 hours. Unless discharge instructions indicate otherwise, you may remove your bandages 24-48 hours after surgery, and you may shower at that time.  You may have steri-strips (small skin tapes) in place directly over the incision.  These strips should be left on the skin for 7-10 days.  If your surgeon used skin glue on the incision, you may shower in 24 hours.  The glue will flake off over the next 2-3 weeks.  Any  sutures or staples will be removed at the office during your follow-up visit. ACTIVITIES:  You may resume regular daily activities (gradually increasing) beginning the next day.  Wearing a good support bra or sports bra minimizes pain and swelling.  You may have sexual intercourse when it is comfortable. You may drive when you no longer are taking prescription pain medication, you can comfortably wear a seatbelt, and you can safely maneuver your car and apply brakes. RETURN TO WORK:  ______________________________________________________________________________________ You should see your doctor in the office for a follow-up appointment approximately two weeks after your surgery.  Your doctor's nurse will typically make your follow-up appointment when she calls you with your pathology report.  Expect your pathology report 2-3 business days after your surgery.  You may call to check if you do not hear from us after three days. OTHER INSTRUCTIONS: _______________________________________________________________________________________________ _____________________________________________________________________________________________________________________________________ _____________________________________________________________________________________________________________________________________ _____________________________________________________________________________________________________________________________________  WHEN TO CALL YOUR DOCTOR: Fever over 101.0 Nausea and/or vomiting. Extreme swelling or bruising. Continued bleeding from incision. Increased pain, redness, or drainage from the incision.  The clinic staff is available to answer your questions during regular business hours.  Please don't hesitate to call and ask to speak to one of the nurses for clinical concerns.  If you have a medical emergency, go to the nearest emergency room or call 911.  A surgeon from [facility] is always on call at the hospital.  For further questions, please visit [email or web address]    Post Anesthesia Home Care Instructions  Activity: Get plenty of rest for the remainder of   the day. A responsible individual must stay with you for 24 hours following the procedure.  For the next 24 hours, DO NOT: -Drive a car -Operate machinery -Drink alcoholic beverages -Take any medication unless instructed by your physician -Make any legal decisions or sign important papers.  Meals: Start with liquid foods such as gelatin or soup. Progress to regular foods as tolerated. Avoid greasy, spicy, heavy foods. If nausea and/or vomiting occur, drink only clear liquids until the nausea and/or vomiting subsides. Call your physician if vomiting continues.  Special Instructions/Symptoms: Your throat may feel dry or sore from the anesthesia or the breathing tube placed in your throat during surgery. If this causes discomfort, gargle with warm salt water. The discomfort should disappear within 24 hours.  If you had a scopolamine patch placed behind your ear for the management of post- operative nausea and/or vomiting:  1. The medication in the patch is effective for 72 hours, after which it should be removed.  Wrap patch in a tissue and discard in the trash. Wash hands thoroughly with soap and water. 2. You may remove the patch earlier than 72 hours if you experience unpleasant side effects which may include dry mouth, dizziness or visual disturbances. 3. Avoid touching the patch. Wash your hands with soap and water after contact with the patch.      

## 2021-06-05 NOTE — H&P (Signed)
Jill Hicks Location: [facility] Patient #: [id number] DOB: 1981/11/28 Married / Language: English / Race: Black or African American Female     History of Present Illness Patient words: Patient presents at the request of the [facility] [place] for abnormal left mammogram.  She was found to have an area of calcifications and a 5 mm left breast mass.  Core biopsy showed papilloma without atypia.  No history of breast cancer in her family.  No history of breast mass, nipple discharge or any other symptom.           The patient was called back for a left breast mass and left breast calcifications   EXAM: DIGITAL DIAGNOSTIC LEFT MAMMOGRAM WITH CAD AND TOMOSYNTHESIS   ULTRASOUND BREAST LEFT   TECHNIQUE: Left digital diagnostic mammography, ultrasound and breast tomosynthesis was performed. Digital images of the breast were evaluated with computer-aided detection. Targeted ultrasound examination of the breast was performed.   COMPARISON:  Previous exam(s).   ACR Breast Density Category c: The breast tissue is heterogeneously dense, which may obscure small masses.   FINDINGS: The calcifications in the superolateral left breast persists on additional imaging and do not definitely layer. These calcifications span 5 mm.   The possible mass in the left breast resolves on the cc view. The finding also improves but does not completely resolve on the spot MLO and 90 degree lateral full paddle view.   On physical exam, no suspicious lumps are identified.   Targeted ultrasound is performed, showing fibrocystic changes throughout the superior left breast. There is a hypoechoic mass at 12 o'clock, 4 cm from the nipple measuring 4 x 3 x 5 mm which could represent a solid mass. No axillary adenopathy.   IMPRESSION: Indeterminate left breast calcifications. Indeterminate mass in the left breast seen with ultrasound at 12 o'clock, 4 cm from the nipple.    RECOMMENDATION: Recommend stereotactic biopsy of the left breast calcifications. Recommend ultrasound-guided biopsy of the 12 o'clock left breast mass.   I have discussed the findings and recommendations with the patient. If applicable, a reminder letter will be sent to the patient regarding the next appointment.   BI-RADS CATEGORY  4: Suspicious.     Electronically Signed  By: Dorise Bullion III M.D  On: 01/09/2021 14:46     The patient was called back for a left breast mass and left breast calcifications   EXAM: DIGITAL DIAGNOSTIC LEFT MAMMOGRAM WITH CAD AND TOMOSYNTHESIS   ULTRASOUND BREAST LEFT   TECHNIQUE: Left digital diagnostic mammography, ultrasound and breast tomosynthesis was performed. Digital images of the breast were evaluated with computer-aided detection. Targeted ultrasound examination of the breast was performed.   COMPARISON:  Previous exam(s).   ACR Breast Density Category c: The breast tissue is heterogeneously dense, which may obscure small masses.   FINDINGS: The calcifications in the superolateral left breast persists on additional imaging and do not definitely layer. These calcifications span 5 mm.   The possible mass in the left breast resolves on the cc view. The finding also improves but does not completely resolve on the spot MLO and 90 degree lateral full paddle view.   On physical exam, no suspicious lumps are identified.   Targeted ultrasound is performed, showing fibrocystic changes throughout the superior left breast. There is a hypoechoic mass at 12 o'clock, 4 cm from the nipple measuring 4 x 3 x 5 mm which could represent a solid mass. No axillary adenopathy.   IMPRESSION: Indeterminate left  breast calcifications. Indeterminate mass in the left breast seen with ultrasound at 12 o'clock, 4 cm from the nipple.   RECOMMENDATION: Recommend stereotactic biopsy of the left breast calcifications. Recommend ultrasound-guided  biopsy of the 12 o'clock left breast mass.   I have discussed the findings and recommendations with the patient. If applicable, a reminder letter will be sent to the patient regarding the next appointment.   BI-RADS CATEGORY  4: Suspicious.     Electronically Signed  By: Dorise Bullion III M.D  On: 01/09/2021 14:46                 Diagnosis 1. 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 2. Breast, left, needle core biopsy, 12 o'clock 4 cmfn - INTRADUCTAL PAPILLOMA. Microscopic.   The patient is a 40 year old female.     Past Surgical History (Breast Biopsy   Left.   Diagnostic Studies History PM) Colonoscopy   never Mammogram   within last year Pap Smear   1-5 years ago   AllergiesNo Known Drug Allergies   [02/25/2021]:   Medication History (No Current Medications  Medications Reconciled    Social History Alcohol use   Remotely quit alcohol use. Caffeine use   Tea. No drug use   Tobacco use   Never smoker.   Family History Arthritis   Mother. Cerebrovascular Accident   Family Members In General. Diabetes Mellitus   Family Members In General. Hypertension   Family Members In General. Prostate Cancer   Family Members In General.   Pregnancy / Birth History Age at menarche   6 years. Contraceptive History   Oral contraceptives. Gravida   3 Length (months) of breastfeeding   12-24 Maternal age   13-30 Para   2 Regular periods     Other Problems       Review of Systems  General Not Present- Appetite Loss, Chills, Fatigue, Fever, Night Sweats, Weight Gain and Weight Loss. Skin Not Present- Change in Wart/Mole, Dryness, Hives, Jaundice, New Lesions, Non-Healing Wounds, Rash and Ulcer. HEENT Not Present- Earache, Hearing Loss, Hoarseness, Nose Bleed, Oral Ulcers, Ringing in the Ears, Seasonal Allergies, Sinus Pain, Sore Throat, Visual Disturbances, Wears  glasses/contact lenses and Yellow Eyes. Respiratory Not Present- Bloody sputum, Chronic Cough, Difficulty Breathing, Snoring and Wheezing. Breast Not Present- Breast Mass, Breast Pain, Nipple Discharge and Skin Changes. Cardiovascular Not Present- Chest Pain, Difficulty Breathing Lying Down, Leg Cramps, Palpitations, Rapid Heart Rate, Shortness of Breath and Swelling of Extremities. Gastrointestinal Present- Excessive gas. Not Present- Abdominal Pain, Bloating, Bloody Stool, Change in Bowel Habits, Chronic diarrhea, Constipation, Difficulty Swallowing, Gets full quickly at meals, Hemorrhoids, Indigestion, Nausea, Rectal Pain and Vomiting. Female Genitourinary Not Present- Frequency, Nocturia, Painful Urination, Pelvic Pain and Urgency. Musculoskeletal Not Present- Back Pain, Joint Pain, Joint Stiffness, Muscle Pain, Muscle Weakness and Swelling of Extremities. Neurological Not Present- Decreased Memory, Fainting, Headaches, Numbness, Seizures, Tingling, Tremor, Trouble walking and Weakness. Psychiatric Not Present- Anxiety, Bipolar, Change in Sleep Pattern, Depression, Fearful and Frequent crying. Endocrine Not Present- Cold Intolerance, Excessive Hunger, Hair Changes, Heat Intolerance, Hot flashes and New Diabetes. Hematology Not Present- Blood Thinners, Easy Bruising, Excessive bleeding, Gland problems, HIV and Persistent Infections.   Vitals  02/25/2021 3:06 PM Weight: 139.25 lb   Height: 66 in  Body Surface Area: 1.71 m   Body Mass Index: 22.48 kg/m   Pulse: 107 (Regular)  Physical Exam Mental Status - Alert. General Appearance - Consistent with stated age. Hydration - Well hydrated. Voice - Normal.   Head and Neck Head - normocephalic, atraumatic with no lesions or palpable masses. Trachea - midline. Thyroid Gland Characteristics - normal size and consistency.   Breast Breast - Left - Symmetric, Non Tender, No Biopsy scars, no Dimpling - Left, No Inflammation, No  Lumpectomy scars, No Mastectomy scars, No Peau d' Orange. Breast - Right - Symmetric, Non Tender, No Biopsy scars, no Dimpling - Right, No Inflammation, No Lumpectomy scars, No Mastectomy scars, No Peau d' Orange. Breast Lump - No Palpable Breast Mass.   Neurologic Neurologic evaluation reveals  - alert and oriented x 3 with no impairment of recent or remote memory. Mental Status - Normal.   Lymphatic Head & Neck   General Head & Neck Lymphatics: Bilateral - Description - Normal. Axillary   General Axillary Region: Bilateral - Description - Normal. Tenderness - Non Tender.       Assessment & Plan PAPILLOMA OF LEFT BREAST (D24.2) Impression: Discussed the pros and cons of surgical excision. Reviewed potential malignancy rates of 2.5 - 3% in the literature. Reviewed upgrade risk of 7-9% to atypical ductal or lobular lesion. Discussed observation as well given the low risk given characteristics of tumor being less than a centimeter in size, no palpable mass, no discharge and no metachronous lesions. She will think over surgery and let us know. I discussed the localized left breast lumpectomy, palpitations, recovery time, a long-term expectations. I've asked her to contact us sooner way so we can have a plan in place for her   total time 30 minutes Current Plans Pt Education - [employer] Free Text Education/Instructions: discussed with patient and provided information.

## 2021-06-05 NOTE — Interval H&P Note (Signed)
History and Physical Interval Note:  06/05/2021 8:15 AM  Jill Hicks  has presented today for surgery, with the diagnosis of LEFT BREAST PAPILLOMA.  The various methods of treatment have been discussed with the patient and family. After consideration of risks, benefits and other options for treatment, the patient has consented to  Procedure(s): LEFT BREAST LUMPECTOMY WITH RADIOACTIVE SEED LOCALIZATION (Left) as a surgical intervention.  The patient's history has been reviewed, patient examined, no change in status, stable for surgery.  I have reviewed the patient's chart and labs.  Questions were answered to the patient's satisfaction.     [place]

## 2021-06-05 NOTE — Anesthesia Procedure Notes (Addendum)
Procedure Name: LMA Insertion Date/Time: 06/05/2021 8:40 AM Performed by: Ezequiel Kayser, CRNA Pre-anesthesia Checklist: Patient identified, Emergency Drugs available, Suction available and Patient being monitored Patient Re-evaluated:Patient Re-evaluated prior to induction Oxygen Delivery Method: Circle System Utilized Preoxygenation: Pre-oxygenation with 100% oxygen Induction Type: IV induction Ventilation: Mask ventilation without difficulty LMA: LMA inserted LMA Size: 3.0 Number of attempts: 1 Airway Equipment and Method: Bite block Placement Confirmation: positive ETCO2 Tube secured with: Tape Dental Injury: Teeth and Oropharynx as per pre-operative assessment

## 2021-06-05 NOTE — Op Note (Signed)
Peoperative diagnosis: Left breast papilloma  Postoperative diagnosis: Same   Procedure: Left breast seed localized lumpectomy  Surgeon: Erroll Luna M.D.  Anesthesia: Gen. With 0.25% Sensorcaine local  EBL: 20 cc  Specimen: Left breast tissue with clip and radioactive seed in the specimen. Verified with neoprobe and radiographic image showing both seed and clip in specimen  Indications for procedure: The patient presents for left breast excisional lumpectomy after core biopsy showed papilloma. Discussed the rationale for considering excision. Small risk of malignancy associated with papilloma lesion after core biopsy. Discussed observation. Discussed wire localization. Patient desired excision of left breast papilloma.The procedure has been discussed with the patient. Alternatives to surgery have been discussed with the patient.  Risks of surgery include bleeding,  Infection,  Seroma formation, death,  and the need for further surgery.   The patient understands and wishes to proceed.   Description of procedure: Patient underwent seed placement as an outpatient. Patient presents today for left breast seed localized lumpectomy. Patient and holding area. Questions are answered and neoprobe used to verify seed location. Patient taken back to the operating room and placed upon the OR table. After induction of general anesthesia, left breast prepped and draped in a sterile fashion. Timeout was done to verify proper  procedure. Neoprobe used and hot spot identified and left breast upper-outer quadrant. This was marked with pen. Curvilinear incision made around the NAC. Dissection done with the help of a neoprobe around the tissue where the seed and clip were located. Tissue removed in its entirety with gross  negative margins.. Neoprobe used and seed within specimen. Radiographs taken which show clip and seed in the  specimen. Hemostasis achieved and cavity closed with 3-0 Vicryl and 4-0 Monocryl.  Dermabond applied. All final counts found to be correct. Specimen transported to pathology. Patient awoke extubated taken to recovery in satisfactory condition.

## 2021-06-05 NOTE — Transfer of Care (Signed)
Immediate Anesthesia Transfer of Care Note  Patient: Jill Hicks  Procedure(s) Performed: LEFT BREAST LUMPECTOMY WITH RADIOACTIVE SEED LOCALIZATION (Left: Breast)  Patient Location: PACU  Anesthesia Type:General  Level of Consciousness: drowsy  Airway & Oxygen Therapy: Patient Spontanous Breathing and Patient connected to face mask oxygen  Post-op Assessment: Report given to RN and Post -op Vital signs reviewed and stable  Post vital signs: Reviewed and stable  Last Vitals:  Vitals Value Taken Time  BP 108/81 06/05/21 0938  Temp    Pulse 54 06/05/21 0939  Resp 12 06/05/21 0939  SpO2 100 % 06/05/21 0939  Vitals shown include unvalidated device data.  Last Pain:  Vitals:   06/05/21 0714  TempSrc: Oral  PainSc: 0-No pain      Patients Stated Pain Goal: 1 (66/29/47 [id number])  Complications: No notable events documented.

## 2021-06-06 ENCOUNTER — Encounter (HOSPITAL_BASED_OUTPATIENT_CLINIC_OR_DEPARTMENT_OTHER): Payer: Self-pay | Admitting: Surgery

## 2021-06-10 LAB — SURGICAL PATHOLOGY

## 2022-01-21 IMAGING — US US BREAST BX W LOC DEV 1ST LESION IMG BX SPEC US GUIDE*L*
1 series · 8 of 8 positions shown · non-contrast
Comparison: Previous exams.
COMPARISON: Previous exams.

Addendum:
CLINICAL DATA: 39-year-old female with indeterminate left breast
calcifications and mass.

EXAM:
LEFT BREAST STEREOTACTIC CORE NEEDLE BIOPSY
LEFT BREAST ULTRASOUND CORE NEEDLE BIOPSY

[Series 1: us breast bx w loc dev 1st lesion img bx spec us g · 0.05mm/px · 8 of 8 slices shown]
[im 1/8]
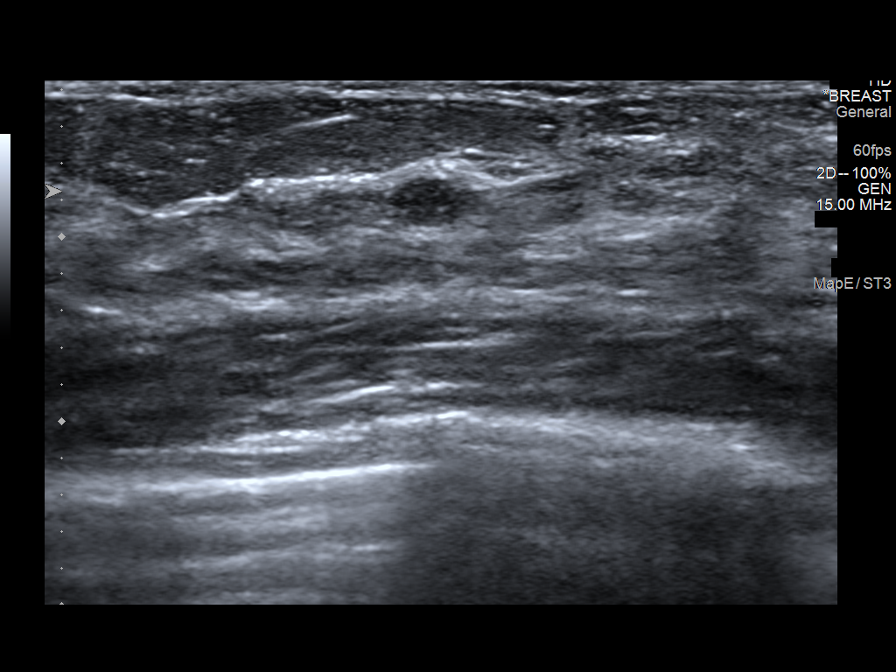
[im 2/8]
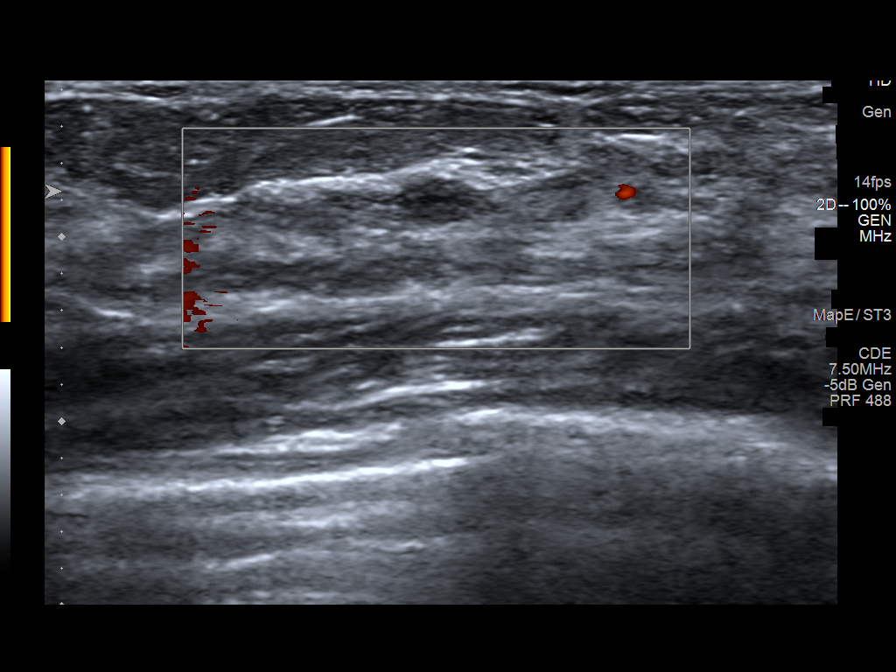
[im 3/8]
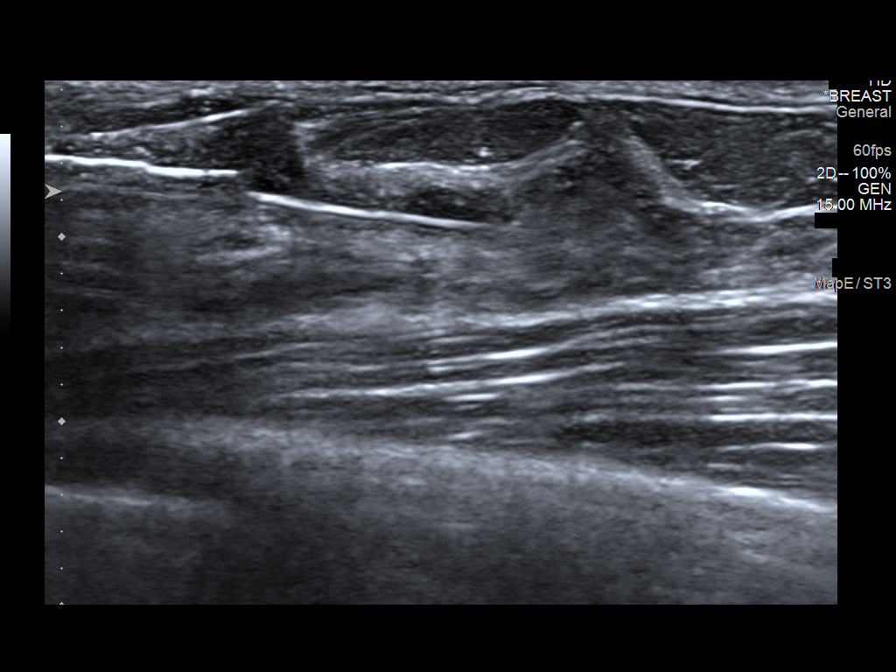
[im 4/8]
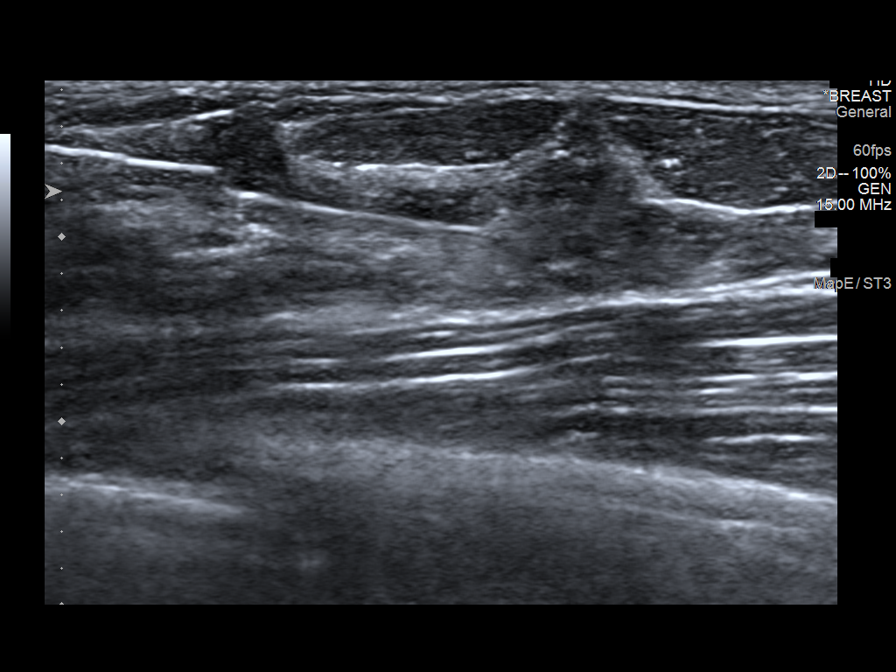
[im 5/8]
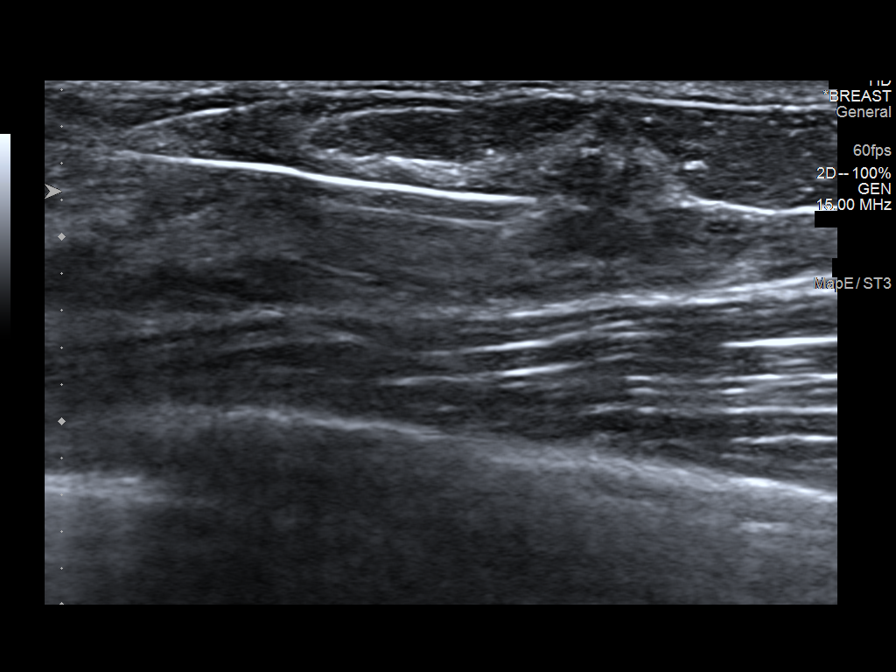
[im 6/8]
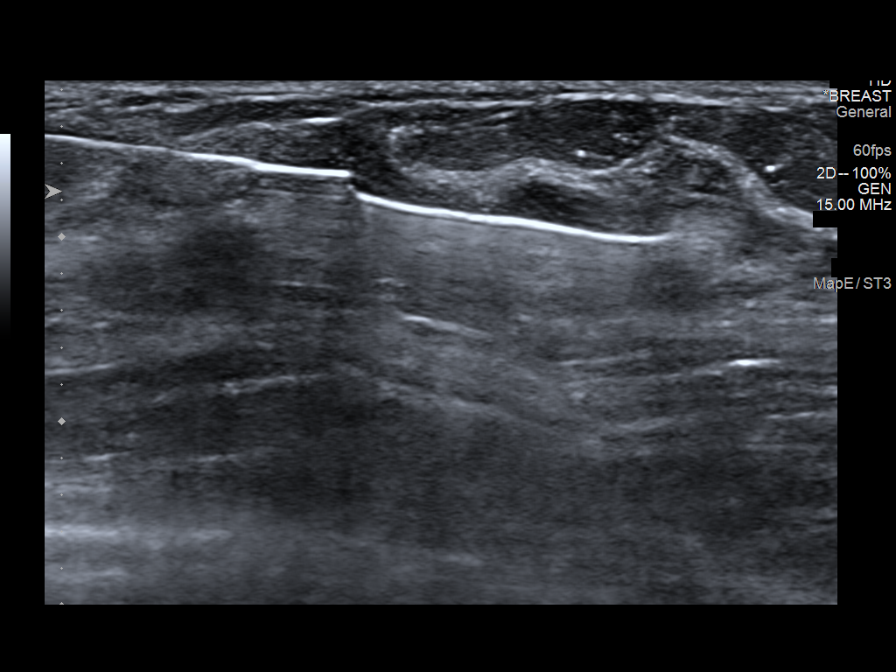
[im 7/8]
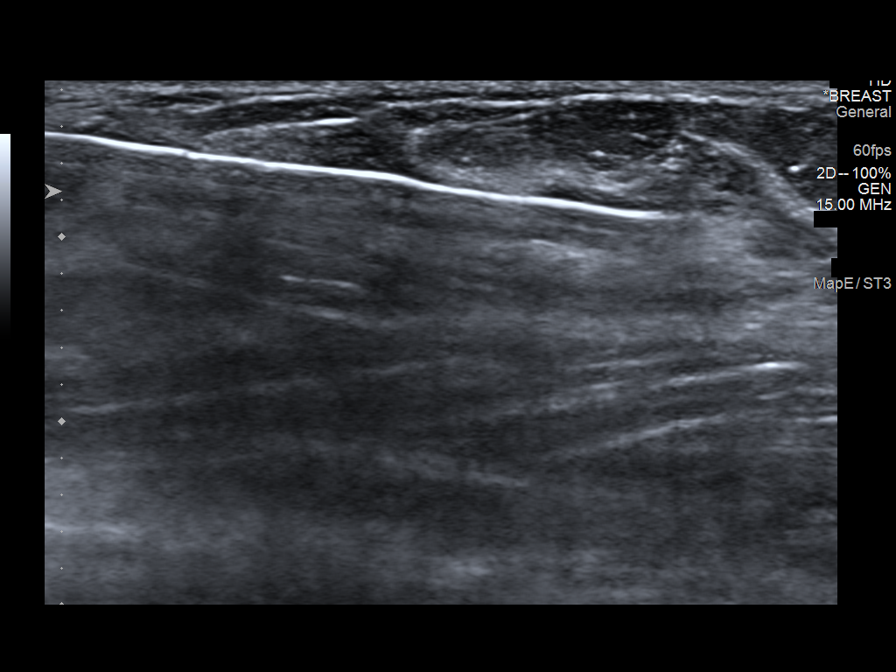
[im 8/8]
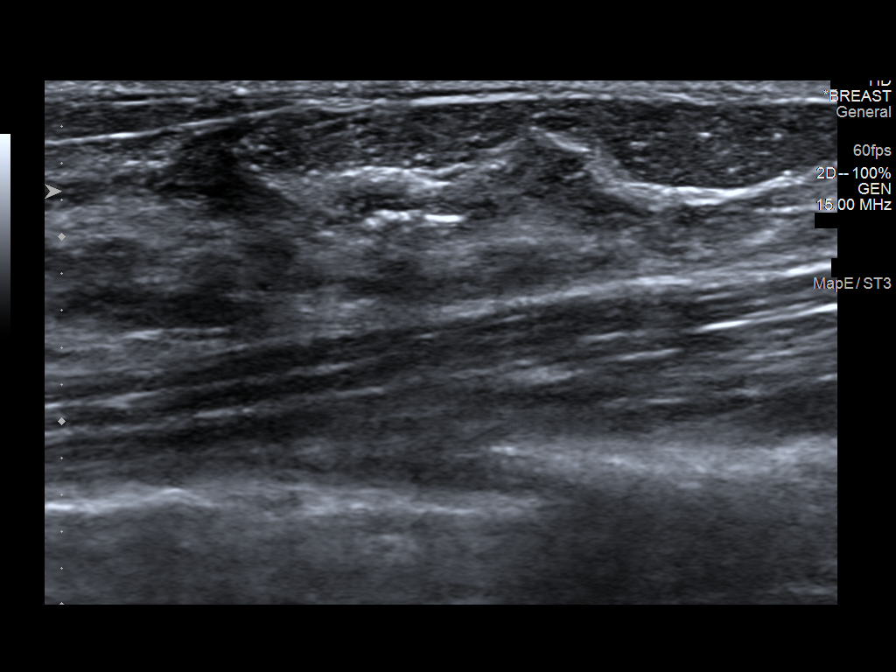

[8 of 8 positions shown; findings below may reference images not displayed]



Lesion quadrant: Upper outer quadrant

Using sterile technique and 1% Lidocaine as local anesthetic, under
stereotactic guidance, a 9 gauge vacuum assisted device was used to
perform core needle biopsy of calcifications in the upper-outer
quadrant of the left breast using a lateral approach. Specimen
radiograph was performed showing calcifications in 1 of 5 specimens.
Specimens with calcifications are identified for pathology. At the
conclusion of the procedure, a coil shaped tissue marker clip was
deployed into the biopsy cavity.

Lesion quadrant: Upper outer quadrant

Using sterile technique and 1% Lidocaine as local anesthetic, under
direct ultrasound visualization, a 14 gauge Subbu device was
used to perform biopsy of a mass at the 12 o'clock position 4 cm
from the nipple using a medial approach. At the conclusion of the
procedure a ribbon shaped tissue marker clip was deployed into the
biopsy cavity.

Follow-up 2-view mammogram was performed and dictated separately.
IMPRESSION: Stereotactic-guided biopsy and ultrasound-guided biopsy of the left
breast. No apparent complications.

ADDENDUM:
Pathology revealed COLUMNAR CELL HYPERPLASIA WITH CALCIFICATIONS,
PSEUDOANGIOMATOUS STROMAL HYPERPLASIA of the Left breast, upper
outer posterior. This was found to be concordant by Dr. Ourari
Orava.

Pathology revealed INTRADUCTAL PAPILLOMA of the Left breast, 12
o'clock 4 cmfn. This was found to be concordant by Dr. Ourari
Orava, with excision recommended.

Pathology results were discussed with the patient by telephone. The
patient reported doing well after the biopsies with tenderness at
the sites. Post biopsy instructions and care were reviewed and
questions were answered. The patient was encouraged to call The
direct phone number was provided.

Surgical consultation has been arranged with Dr. Franciani Horas at
[REDACTED] on February 08, 2021.

Pathology results reported by Nika Hamblin, RN on 01/14/2021.



Lesion quadrant: Upper outer quadrant

Using sterile technique and 1% Lidocaine as local anesthetic, under
stereotactic guidance, a 9 gauge vacuum assisted device was used to
perform core needle biopsy of calcifications in the upper-outer
quadrant of the left breast using a lateral approach. Specimen
radiograph was performed showing calcifications in 1 of 5 specimens.
Specimens with calcifications are identified for pathology. At the
conclusion of the procedure, a coil shaped tissue marker clip was
deployed into the biopsy cavity.

Lesion quadrant: Upper outer quadrant

Using sterile technique and 1% Lidocaine as local anesthetic, under
direct ultrasound visualization, a 14 gauge Subbu device was
used to perform biopsy of a mass at the 12 o'clock position 4 cm
from the nipple using a medial approach. At the conclusion of the
procedure a ribbon shaped tissue marker clip was deployed into the
biopsy cavity.

Follow-up 2-view mammogram was performed and dictated separately.
IMPRESSION: Stereotactic-guided biopsy and ultrasound-guided biopsy of the left
breast. No apparent complications.

## 2022-01-21 IMAGING — MG MM BREAST BX W LOC DEV 1ST LESION IMAGE BX SPEC STEREO GUIDE*L*
6 of 11 series · 6 of 31 positions shown · non-contrast
Comparison: Previous exams.
COMPARISON: Previous exams.

Addendum:
CLINICAL DATA: 39-year-old female with indeterminate left breast
calcifications and mass.

EXAM:
LEFT BREAST STEREOTACTIC CORE NEEDLE BIOPSY
LEFT BREAST ULTRASOUND CORE NEEDLE BIOPSY

[L (1 of 5)]
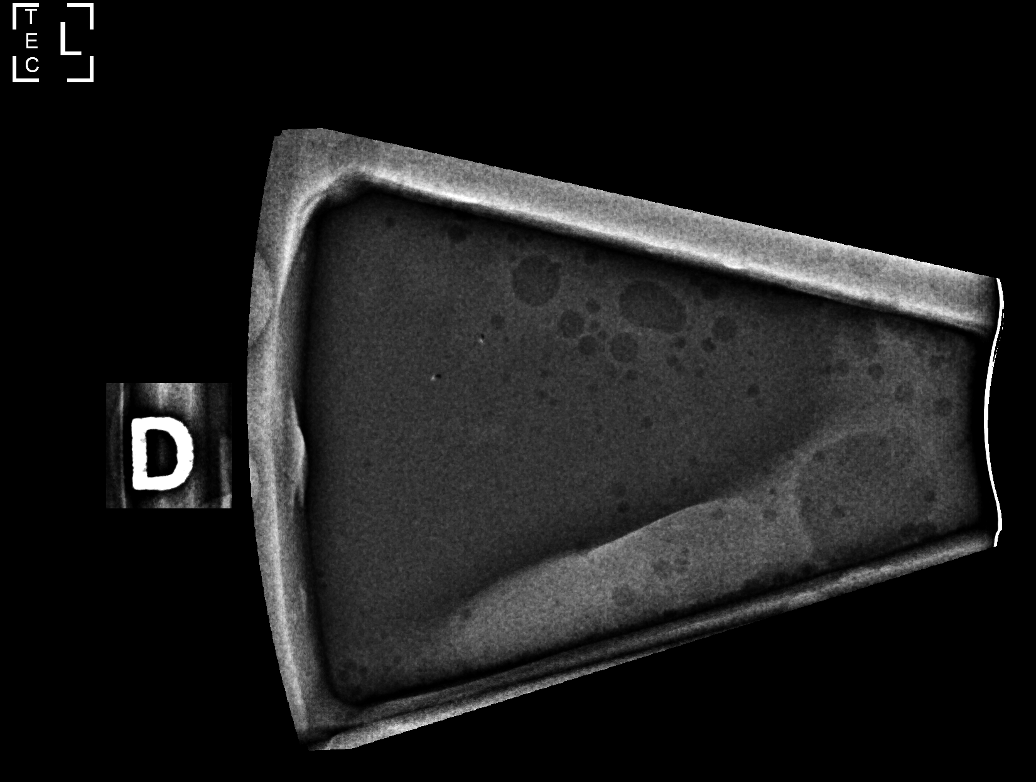

[L (2 of 5)]
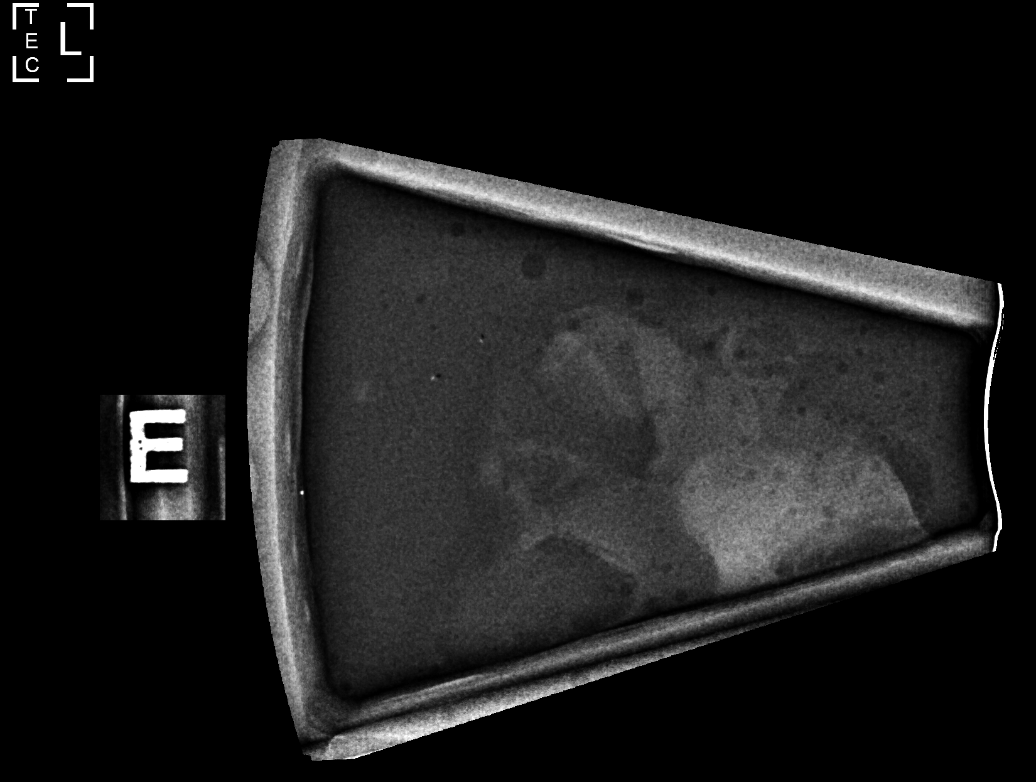

[L (3 of 5)]
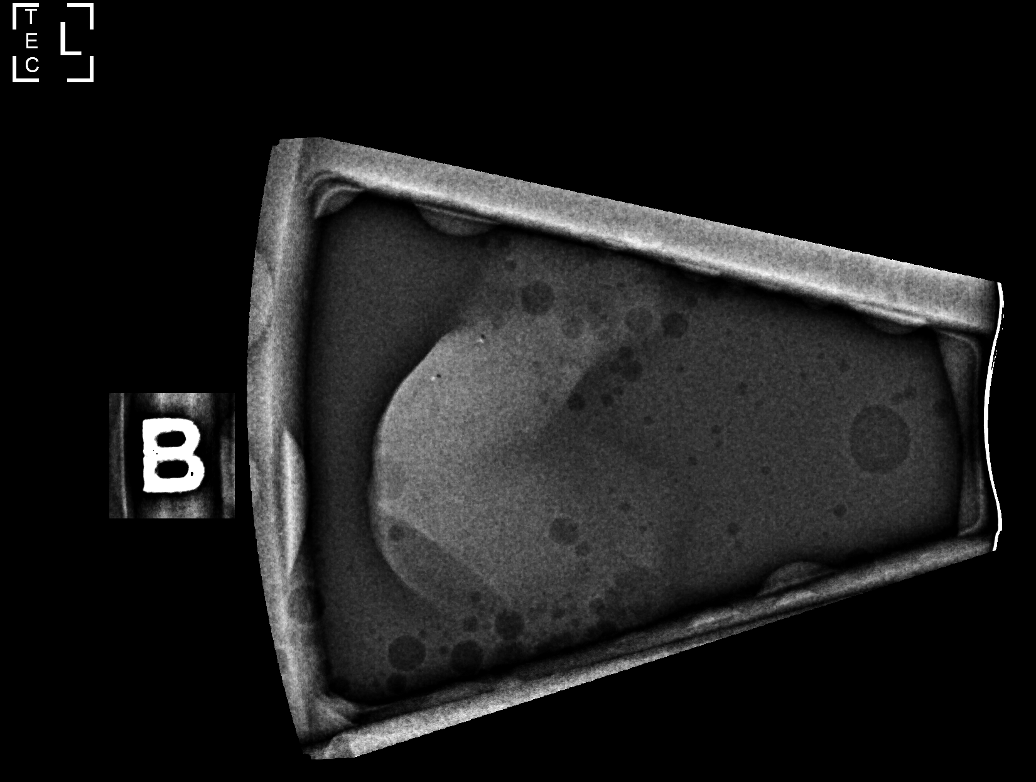

[L (4 of 5)]
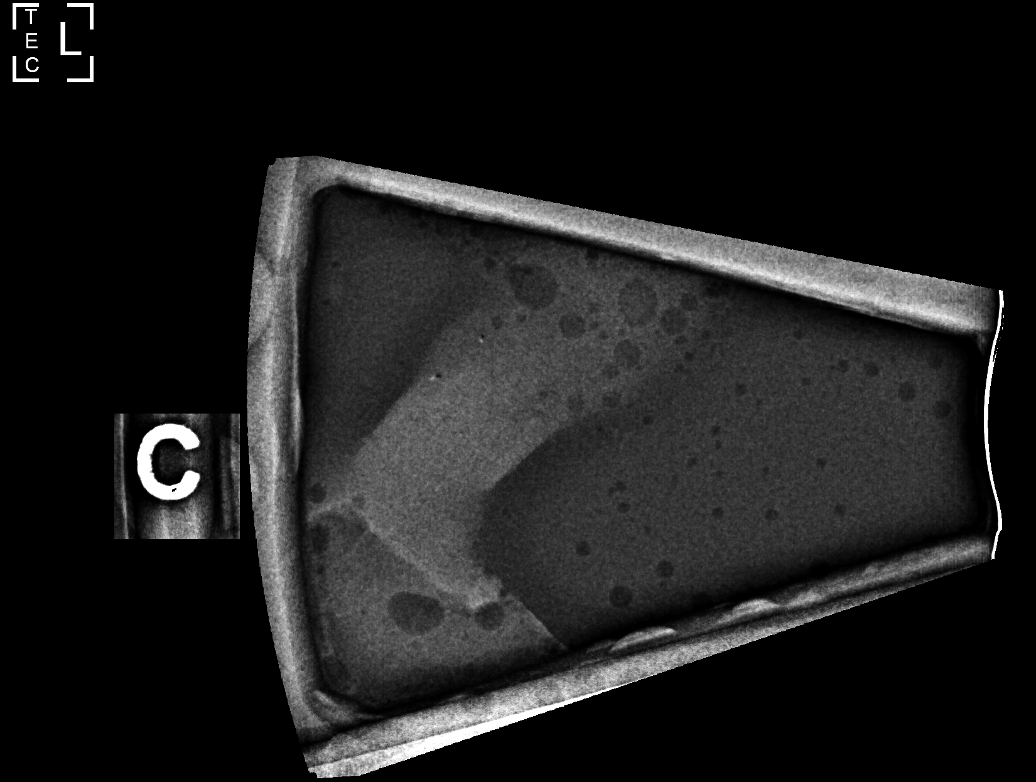

[L (5 of 5)]
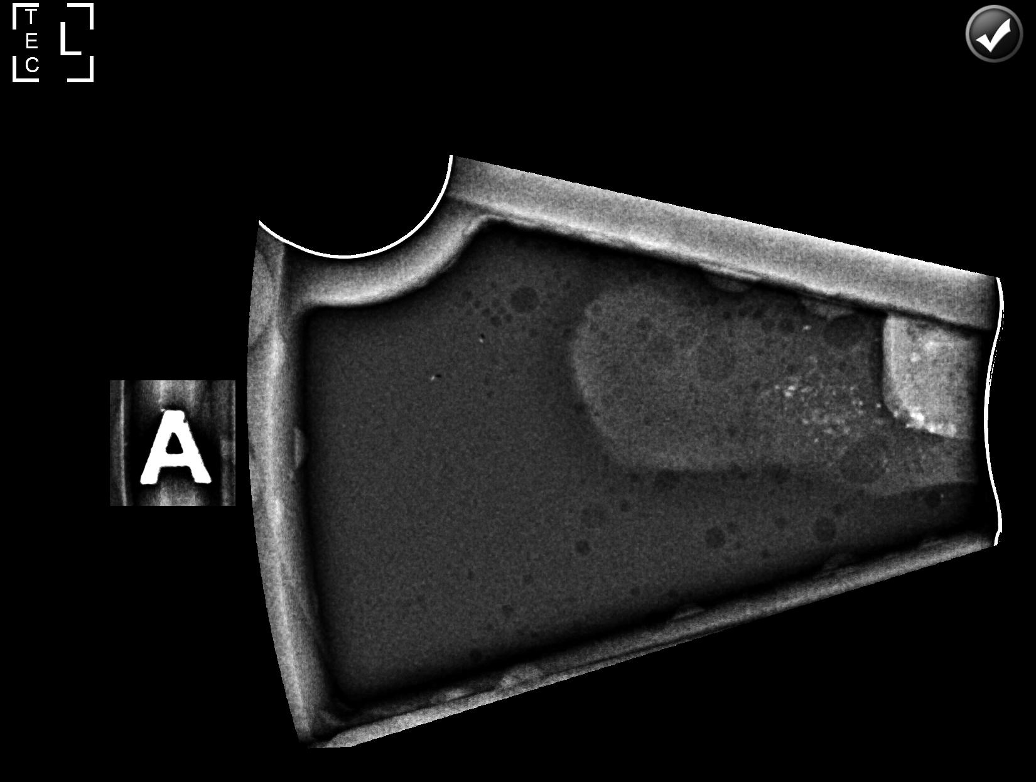

[L LM]
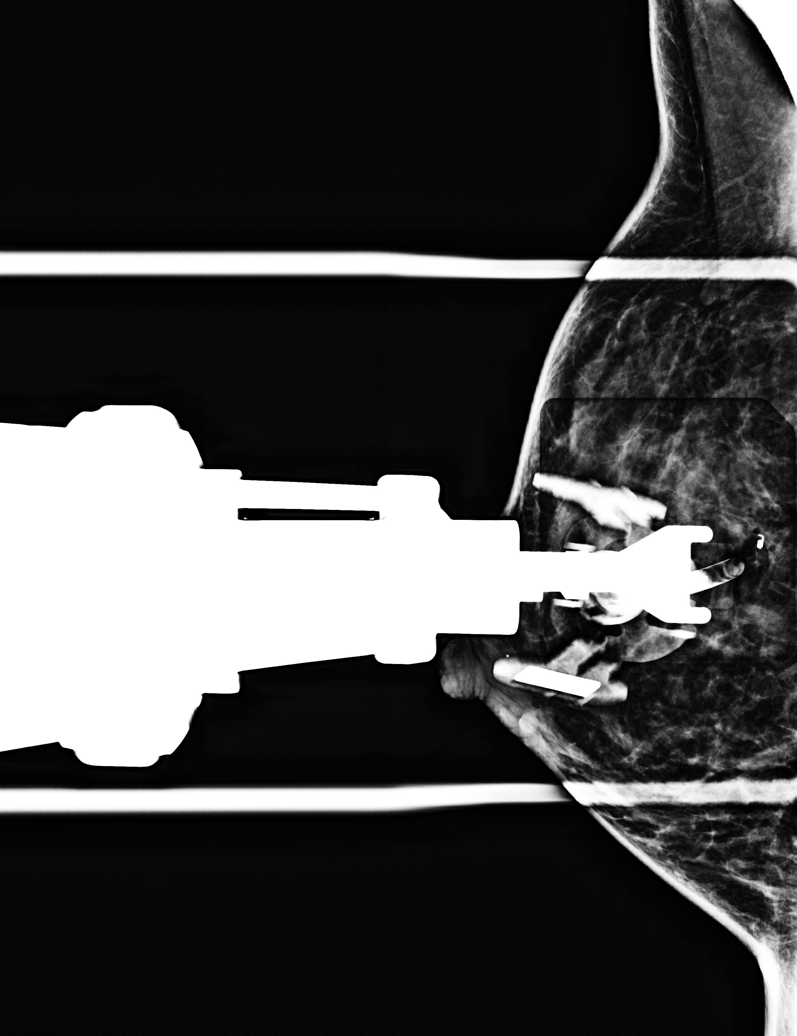

[6 of 31 positions shown; findings below may reference images not displayed]



Lesion quadrant: Upper outer quadrant

Using sterile technique and 1% Lidocaine as local anesthetic, under
stereotactic guidance, a 9 gauge vacuum assisted device was used to
perform core needle biopsy of calcifications in the upper-outer
quadrant of the left breast using a lateral approach. Specimen
radiograph was performed showing calcifications in 1 of 5 specimens.
Specimens with calcifications are identified for pathology. At the
conclusion of the procedure, a coil shaped tissue marker clip was
deployed into the biopsy cavity.

Lesion quadrant: Upper outer quadrant

Using sterile technique and 1% Lidocaine as local anesthetic, under
direct ultrasound visualization, a 14 gauge Subbu device was
used to perform biopsy of a mass at the 12 o'clock position 4 cm
from the nipple using a medial approach. At the conclusion of the
procedure a ribbon shaped tissue marker clip was deployed into the
biopsy cavity.

Follow-up 2-view mammogram was performed and dictated separately.
IMPRESSION: Stereotactic-guided biopsy and ultrasound-guided biopsy of the left
breast. No apparent complications.

ADDENDUM:
Pathology revealed COLUMNAR CELL HYPERPLASIA WITH CALCIFICATIONS,
PSEUDOANGIOMATOUS STROMAL HYPERPLASIA of the Left breast, upper
outer posterior. This was found to be concordant by Dr. Ourari
Orava.

Pathology revealed INTRADUCTAL PAPILLOMA of the Left breast, 12
o'clock 4 cmfn. This was found to be concordant by Dr. Ourari
Orava, with excision recommended.

Pathology results were discussed with the patient by telephone. The
patient reported doing well after the biopsies with tenderness at
the sites. Post biopsy instructions and care were reviewed and
questions were answered. The patient was encouraged to call The
direct phone number was provided.

Surgical consultation has been arranged with Dr. Franciani Horas at
[REDACTED] on February 08, 2021.

Pathology results reported by Nika Hamblin, RN on 01/14/2021.



Lesion quadrant: Upper outer quadrant

Using sterile technique and 1% Lidocaine as local anesthetic, under
stereotactic guidance, a 9 gauge vacuum assisted device was used to
perform core needle biopsy of calcifications in the upper-outer
quadrant of the left breast using a lateral approach. Specimen
radiograph was performed showing calcifications in 1 of 5 specimens.
Specimens with calcifications are identified for pathology. At the
conclusion of the procedure, a coil shaped tissue marker clip was
deployed into the biopsy cavity.

Lesion quadrant: Upper outer quadrant

Using sterile technique and 1% Lidocaine as local anesthetic, under
direct ultrasound visualization, a 14 gauge Subbu device was
used to perform biopsy of a mass at the 12 o'clock position 4 cm
from the nipple using a medial approach. At the conclusion of the
procedure a ribbon shaped tissue marker clip was deployed into the
biopsy cavity.

Follow-up 2-view mammogram was performed and dictated separately.
IMPRESSION: Stereotactic-guided biopsy and ultrasound-guided biopsy of the left
breast. No apparent complications.

## 2022-04-01 ENCOUNTER — Encounter (HOSPITAL_COMMUNITY): Payer: Self-pay

## 2024-09-08 ENCOUNTER — Inpatient Hospital Stay

## 2024-09-08 ENCOUNTER — Inpatient Hospital Stay: Attending: Hematology and Oncology | Admitting: Hematology and Oncology

## 2024-09-08 ENCOUNTER — Encounter: Payer: Self-pay | Admitting: Hematology and Oncology

## 2024-09-08 VITALS — BP 120/59 | HR 61 | Temp 98.2°F | Resp 18 | Ht 66.0 in | Wt 133.0 lb

## 2024-09-08 DIAGNOSIS — D509 Iron deficiency anemia, unspecified: Secondary | ICD-10-CM

## 2024-09-08 DIAGNOSIS — N924 Excessive bleeding in the premenopausal period: Secondary | ICD-10-CM

## 2024-09-08 DIAGNOSIS — N920 Excessive and frequent menstruation with regular cycle: Secondary | ICD-10-CM | POA: Insufficient documentation

## 2024-09-08 DIAGNOSIS — D649 Anemia, unspecified: Secondary | ICD-10-CM | POA: Diagnosis not present

## 2024-09-08 LAB — CBC WITH DIFFERENTIAL (CANCER CENTER ONLY)
Abs Immature Granulocytes: 0 K/uL (ref 0.00–0.07)
Basophils Absolute: 0.1 K/uL (ref 0.0–0.1)
Basophils Relative: 1 %
Eosinophils Absolute: 0 K/uL (ref 0.0–0.5)
Eosinophils Relative: 1 %
HCT: 35.4 % — ABNORMAL LOW (ref 36.0–46.0)
Hemoglobin: 11.1 g/dL — ABNORMAL LOW (ref 12.0–15.0)
Immature Granulocytes: 0 %
Lymphocytes Relative: 56 %
Lymphs Abs: 2.2 K/uL (ref 0.7–4.0)
MCH: 28.5 pg (ref 26.0–34.0)
MCHC: 31.4 g/dL (ref 30.0–36.0)
MCV: 90.8 fL (ref 80.0–100.0)
Monocytes Absolute: 0.2 K/uL (ref 0.1–1.0)
Monocytes Relative: 6 %
Neutro Abs: 1.4 K/uL — ABNORMAL LOW (ref 1.7–7.7)
Neutrophils Relative %: 36 %
Platelet Count: 327 K/uL (ref 150–400)
RBC: 3.9 MIL/uL (ref 3.87–5.11)
RDW: 11.8 % (ref 11.5–15.5)
WBC Count: 3.9 K/uL — ABNORMAL LOW (ref 4.0–10.5)
nRBC: 0 % (ref 0.0–0.2)

## 2024-09-08 LAB — VITAMIN B12: Vitamin B-12: 563 pg/mL (ref 180–914)

## 2024-09-08 LAB — IRON AND IRON BINDING CAPACITY (CC-WL,HP ONLY)
Iron: 57 ug/dL (ref 28–170)
Saturation Ratios: 16 % (ref 10.4–31.8)
TIBC: 351 ug/dL (ref 250–450)
UIBC: 294 ug/dL (ref 148–442)

## 2024-09-08 LAB — RETICULOCYTES
Immature Retic Fract: 6.2 % (ref 2.3–15.9)
RBC.: 3.88 MIL/uL (ref 3.87–5.11)
Retic Count, Absolute: 50.4 K/uL (ref 19.0–186.0)
Retic Ct Pct: 1.3 % (ref 0.4–3.1)

## 2024-09-08 LAB — FERRITIN: Ferritin: 64 ng/mL (ref 11–307)

## 2024-09-08 LAB — SEDIMENTATION RATE: Sed Rate: 8 mm/h (ref 0–22)

## 2024-09-08 NOTE — Assessment & Plan Note (Signed)
 She has chronic anemia for over 10 years The most likelihood with be iron deficiency due to chronic menorrhagia and poor oral iron intake I will order additional labs for further evaluation  She is interested to receive intravenous iron infusion if hemoglobin is less than 10 If hemoglobin is greater than 10, she is interested to try to take oral iron correctly and to enrich her food with high-protein/high iron food intake

## 2024-09-08 NOTE — Progress Notes (Signed)
 [facility] CONSULT NOTE  Patient Care Team: Pcp, No as PCP - General  ASSESSMENT & PLAN:  Chronic anemia She has chronic anemia for over 10 years The most likelihood with be iron deficiency due to chronic menorrhagia and poor oral iron intake I will order additional labs for further evaluation  She is interested to receive intravenous iron infusion if hemoglobin is less than 10 If hemoglobin is greater than 10, she is interested to try to take oral iron correctly and to enrich her food with high-protein/high iron food intake  Menorrhagia She has chronic menorrhagia I will defer to her primary care doctor for management Orders Placed This Encounter  Procedures   CBC with Differential (Cancer Center Only)    Standing Status:   Future    Number of Occurrences:   1    Expiration Date:   09/08/2025   Ferritin    Standing Status:   Future    Number of Occurrences:   1    Expiration Date:   09/08/2025   Iron and Iron Binding Capacity ([facility] only)    Standing Status:   Future    Number of Occurrences:   1    Expiration Date:   09/08/2025   Vitamin B12    Standing Status:   Future    Number of Occurrences:   1    Expiration Date:   09/08/2025   Sedimentation rate    Standing Status:   Future    Number of Occurrences:   1    Expiration Date:   09/08/2025   Reticulocytes    Standing Status:   Future    Number of Occurrences:   1    Expiration Date:   09/08/2025    All questions were answered. The patient knows to call the clinic with any problems, questions or concerns.  The total time spent in the appointment was 55 minutes encounter with patients including review of chart and various tests results, discussions about plan of care and coordination of care plan  Almarie Bedford, MD 9/18/20251:36 PM   CHIEF COMPLAINTS/PURPOSE OF CONSULTATION:  Anemia  HISTORY OF PRESENTING ILLNESS:  Jill Hicks 42 y.o. female is here because of anemia  She was found  to have abnormal CBC from recent blood work I have the opportunity to review her CBC dated back to 2012 On March 29, 2011, her white count was 14, hemoglobin 12.8 and platelet count 291.  This is peripartum.  Repeat labs the next day show white count 16.1, hemoglobin 10.8 and platelet count 246 On June 20, 2014, she had another childbirth.  Her hemoglobin was 11.1 with subsequent day hemoglobin dropped to 10 On June 04, 2021, her hemoglobin was 11.1 On April 17, 20 25, hemoglobin was 12.1 When she saw her referring physician on June 27, 2024, hemoglobin was 10.5  She denies recent chest pain on exertion, shortness of breath on minimal exertion, pre-syncopal episodes, or palpitations. She had not noticed any recent bleeding such as epistaxis, hematuria or hematochezia The patient denies over the counter NSAID ingestion. She is not on antiplatelets agents.  She had no prior history or diagnosis of cancer. Her age appropriate screening programs are up-to-date. She denies any pica and eats a variety of diet.  However, in going through her diet history, she routinely skips breakfast.  She eats food with low iron/meat content in general She never donated blood or received blood transfusion The patient was prescribed oral iron supplements  and she takes 1 daily typically with dinner.  She have history of nausea with oral iron supplement. She have heavy menstrual cycle.  Her menstrual cycle last 8 to 9 days every 28 days.  MEDICAL HISTORY:  Past Medical History:  Diagnosis Date   History of COVID-19 04/24/2021   Papilloma of left breast     SURGICAL HISTORY: Past Surgical History:  Procedure Laterality Date   BREAST LUMPECTOMY WITH RADIOACTIVE SEED LOCALIZATION Left 06/05/2021   Procedure: LEFT BREAST LUMPECTOMY WITH RADIOACTIVE SEED LOCALIZATION;  Surgeon: Vanderbilt Ned, MD;  Location: [facility];  Service: General;  Laterality: Left;   NO PAST SURGERIES      SOCIAL  HISTORY: Social History   Socioeconomic History   Marital status: Married    Spouse name: Not on file   Number of children: 2   Years of education: Not on file   Highest education level: Not on file  Occupational History   Not on file  Tobacco Use   Smoking status: Never   Smokeless tobacco: Never  Vaping Use   Vaping status: Never Used  Substance and Sexual Activity   Alcohol use: Not Currently   Drug use: Never   Sexual activity: Not Currently    Partners: Male    Birth control/protection: None  Other Topics Concern   Not on file  Social History Narrative   Dancer   Married Husband has prostate cancer   2 sons   Social Drivers of Corporate investment banker Strain: Not on file  Food Insecurity: No Food Insecurity (09/08/2024)   Hunger Vital Sign    Worried About Running Out of Food in the Last Year: Never true    Ran Out of Food in the Last Year: Never true  Transportation Needs: No Transportation Needs (09/08/2024)   PRAPARE - Administrator, Civil Service (Medical): No    Lack of Transportation (Non-Medical): No  Physical Activity: Not on file  Stress: Not on file  Social Connections: Not on file  Intimate Partner Violence: Not At Risk (09/08/2024)   Humiliation, Afraid, Rape, and Kick questionnaire    Fear of Current or Ex-Partner: No    Emotionally Abused: No    Physically Abused: No    Sexually Abused: No    FAMILY HISTORY: Family History  Problem Relation Age of Onset   Arthritis Mother    Ulcers Mother    Hypertension Father     ALLERGIES:  is allergic to azithromycin.  MEDICATIONS:  Current Outpatient Medications  Medication Sig Dispense Refill   Ferrous Sulfate (IRON PO) Take 324 mg by mouth.     Cholecalciferol (VITAMIN D-3 PO) Take by mouth daily.     No current facility-administered medications for this visit.    REVIEW OF SYSTEMS:   Constitutional: Denies fevers, chills or abnormal night sweats Eyes: Denies blurriness of  vision, double vision or watery eyes Ears, nose, mouth, throat, and face: Denies mucositis or sore throat Respiratory: Denies cough, dyspnea or wheezes Cardiovascular: Denies palpitation, chest discomfort or lower extremity swelling Gastrointestinal:  Denies nausea, heartburn or change in bowel habits Skin: Denies abnormal skin rashes Lymphatics: Denies new lymphadenopathy or easy bruising Neurological:Denies numbness, tingling or new weaknesses Behavioral/Psych: Mood is stable, no new changes  All other systems were reviewed with the patient and are negative.  PHYSICAL EXAMINATION: ECOG PERFORMANCE STATUS: 0 - Asymptomatic  Vitals:   09/08/24 1314  BP: (!) 120/59  Pulse: 61  Resp: 18  Temp: 98.2 F (36.8 C)  SpO2: 100%   Filed Weights   09/08/24 1314  Weight: 133 lb (60.3 kg)    GENERAL:alert, no distress and comfortable. She is thin SKIN: skin color, texture, turgor are normal, no rashes or significant lesions EYES: normal, conjunctiva are pink and non-injected, sclera clear OROPHARYNX:no exudate, no erythema and lips, buccal mucosa, and tongue normal  NECK: supple, thyroid normal size, non-tender, without nodularity LYMPH:  no palpable lymphadenopathy in the cervical, axillary or inguinal LUNGS: clear to auscultation and percussion with normal breathing effort HEART: regular rate & rhythm and no murmurs and no lower extremity edema ABDOMEN:abdomen soft, non-tender and normal bowel sounds Musculoskeletal:no cyanosis of digits and no clubbing  PSYCH: alert & oriented x 3 with fluent speech NEURO: no focal motor/sensory deficits

## 2024-09-08 NOTE — Assessment & Plan Note (Signed)
 She has chronic menorrhagia I will defer to her primary care doctor for management

## 2024-09-09 ENCOUNTER — Ambulatory Visit: Payer: Self-pay | Admitting: Hematology and Oncology

## 2024-09-09 ENCOUNTER — Other Ambulatory Visit: Payer: Self-pay | Admitting: Hematology and Oncology

## 2024-09-09 DIAGNOSIS — D509 Iron deficiency anemia, unspecified: Secondary | ICD-10-CM

## 2024-09-09 NOTE — Telephone Encounter (Signed)
-----   Message from Almarie Bedford sent at 09/09/2024  8:41 AM EDT ----- Please call her, iron tests are better, so I recommend oral iron only for now ----- Message ----- From: Interface, Lab In [place] Sent: 09/08/2024   1:51 PM EDT To: Almarie Bedford, MD

## 2024-09-09 NOTE — Telephone Encounter (Signed)
 LVM with the following message from Dr. Lonn regarding recent lab results.  Provided call back number should patient with to discuss results further.

## 2024-12-09 ENCOUNTER — Inpatient Hospital Stay: Attending: Hematology and Oncology

## 2024-12-09 ENCOUNTER — Ambulatory Visit: Admitting: Hematology and Oncology

## 2024-12-09 ENCOUNTER — Encounter: Payer: Self-pay | Admitting: Hematology and Oncology

## 2024-12-09 VITALS — BP 105/63 | HR 68 | Temp 98.1°F | Resp 18 | Ht 66.0 in | Wt 132.8 lb

## 2024-12-09 DIAGNOSIS — D649 Anemia, unspecified: Secondary | ICD-10-CM | POA: Diagnosis not present

## 2024-12-09 DIAGNOSIS — N920 Excessive and frequent menstruation with regular cycle: Secondary | ICD-10-CM | POA: Insufficient documentation

## 2024-12-09 DIAGNOSIS — D509 Iron deficiency anemia, unspecified: Secondary | ICD-10-CM

## 2024-12-09 LAB — CBC WITH DIFFERENTIAL (CANCER CENTER ONLY)
Abs Immature Granulocytes: 0.01 K/uL (ref 0.00–0.07)
Basophils Absolute: 0.1 K/uL (ref 0.0–0.1)
Basophils Relative: 1 %
Eosinophils Absolute: 0 K/uL (ref 0.0–0.5)
Eosinophils Relative: 1 %
HCT: 34.5 % — ABNORMAL LOW (ref 36.0–46.0)
Hemoglobin: 11 g/dL — ABNORMAL LOW (ref 12.0–15.0)
Immature Granulocytes: 0 %
Lymphocytes Relative: 49 %
Lymphs Abs: 2.1 K/uL (ref 0.7–4.0)
MCH: 28.7 pg (ref 26.0–34.0)
MCHC: 31.9 g/dL (ref 30.0–36.0)
MCV: 90.1 fL (ref 80.0–100.0)
Monocytes Absolute: 0.2 K/uL (ref 0.1–1.0)
Monocytes Relative: 5 %
Neutro Abs: 1.9 K/uL (ref 1.7–7.7)
Neutrophils Relative %: 44 %
Platelet Count: 297 K/uL (ref 150–400)
RBC: 3.83 MIL/uL — ABNORMAL LOW (ref 3.87–5.11)
RDW: 11.7 % (ref 11.5–15.5)
WBC Count: 4.4 K/uL (ref 4.0–10.5)
nRBC: 0 % (ref 0.0–0.2)

## 2024-12-09 LAB — IRON AND IRON BINDING CAPACITY (CC-WL,HP ONLY)
Iron: 72 ug/dL (ref 28–170)
Saturation Ratios: 21 % (ref 10.4–31.8)
TIBC: 336 ug/dL (ref 250–450)
UIBC: 264 ug/dL

## 2024-12-09 LAB — FERRITIN: Ferritin: 65 ng/mL (ref 11–307)

## 2024-12-09 NOTE — Progress Notes (Signed)
" ° °  [facility] OFFICE PROGRESS NOTE  Pcp, No  ASSESSMENT & PLAN:  Assessment & Plan Chronic anemia She has chronic anemia for over 10 years The most likelihood with be iron deficiency due to chronic menorrhagia  I will order additional labs for further evaluation Her recent iron studies were adequate I suspect she might have undiagnosed thalassemia, further testing is pending I will call her with test results In the meantime, she will continue oral iron supplement    No orders of the defined types were placed in this encounter.   INTERVAL HISTORY: Patient returns for recurrent anemia Symptoms of anemia includes none We reviewed CBC result  SUMMARY OF HEMATOLOGIC HISTORY:  She was found to have abnormal CBC from recent blood work I have the opportunity to review her CBC dated back to 2012 On March 29, 2011, her white count was 14, hemoglobin 12.8 and platelet count 291.  This is peripartum.  Repeat labs the next day show white count 16.1, hemoglobin 10.8 and platelet count 246 On June 20, 2014, she had another childbirth.  Her hemoglobin was 11.1 with subsequent day hemoglobin dropped to 10 On June 04, 2021, her hemoglobin was 11.1 On April 17, 20 25, hemoglobin was 12.1 When she saw her referring physician on June 27, 2024, hemoglobin was 10.5  She denies recent chest pain on exertion, shortness of breath on minimal exertion, pre-syncopal episodes, or palpitations. She had not noticed any recent bleeding such as epistaxis, hematuria or hematochezia The patient denies over the counter NSAID ingestion. She is not on antiplatelets agents.  She had no prior history or diagnosis of cancer. Her age appropriate screening programs are up-to-date. She denies any pica and eats a variety of diet.  However, in going through her diet history, she routinely skips breakfast.  She eats food with low iron/meat content in general She never donated blood or received blood  transfusion The patient was prescribed oral iron supplements and she takes 1 daily typically with dinner.  She have history of nausea with oral iron supplement. She have heavy menstrual cycle.  Her menstrual cycle last 8 to 9 days every 28 days.   Lab Results  Component Value Date   VITAMINB12 563 09/08/2024   FERRITIN 64 09/08/2024   HGB 11.0 (L) 12/09/2024   RBC 3.83 (L) 12/09/2024   Vitals:   12/09/24 1204  BP: 105/63  Pulse: 68  Resp: 18  Temp: 98.1 F (36.7 C)  SpO2: 100%   "

## 2024-12-09 NOTE — Assessment & Plan Note (Addendum)
 She has chronic anemia for over 10 years The most likelihood with be iron deficiency due to chronic menorrhagia  I will order additional labs for further evaluation Her recent iron studies were adequate I suspect she might have undiagnosed thalassemia, further testing is pending I will call her with test results In the meantime, she will continue oral iron supplement

## 2024-12-12 LAB — HGB FRACTIONATION CASCADE
Hgb A2: 2.5 % (ref 1.8–3.2)
Hgb A: 97.5 % (ref 96.4–98.8)
Hgb F: 0 % (ref 0.0–2.0)
Hgb S: 0 %

## 2024-12-23 ENCOUNTER — Telehealth: Payer: Self-pay

## 2024-12-23 LAB — ALPHA-THALASSEMIA ANALYSIS: IMAGE: 0

## 2024-12-23 NOTE — Telephone Encounter (Signed)
 Called and given below message. She verbalized understanding.

## 2024-12-23 NOTE — Telephone Encounter (Signed)
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
